# Patient Record
Sex: Female | Born: 1975 | Race: White | Hispanic: No | Marital: Married | Smoking: Never smoker
Health system: Southern US, Community
[De-identification: ages and names within clinical notes are randomized; demographics above are authoritative.]

## PROBLEM LIST (undated history)

## (undated) ENCOUNTER — Inpatient Hospital Stay (HOSPITAL_COMMUNITY): Payer: Self-pay

## (undated) DIAGNOSIS — D126 Benign neoplasm of colon, unspecified: Secondary | ICD-10-CM

## (undated) DIAGNOSIS — Z8619 Personal history of other infectious and parasitic diseases: Secondary | ICD-10-CM

## (undated) DIAGNOSIS — K649 Unspecified hemorrhoids: Secondary | ICD-10-CM

## (undated) DIAGNOSIS — D179 Benign lipomatous neoplasm, unspecified: Secondary | ICD-10-CM

## (undated) DIAGNOSIS — S62101A Fracture of unspecified carpal bone, right wrist, initial encounter for closed fracture: Secondary | ICD-10-CM

## (undated) DIAGNOSIS — F329 Major depressive disorder, single episode, unspecified: Secondary | ICD-10-CM

## (undated) DIAGNOSIS — F32A Depression, unspecified: Secondary | ICD-10-CM

## (undated) HISTORY — DX: Personal history of other infectious and parasitic diseases: Z86.19

## (undated) HISTORY — DX: Fracture of unspecified carpal bone, right wrist, initial encounter for closed fracture: S62.101A

## (undated) HISTORY — DX: Depression, unspecified: F32.A

## (undated) HISTORY — DX: Major depressive disorder, single episode, unspecified: F32.9

## (undated) HISTORY — DX: Unspecified hemorrhoids: K64.9

## (undated) HISTORY — PX: HERNIA REPAIR: SHX51

## (undated) HISTORY — PX: BREAST SURGERY: SHX581

## (undated) HISTORY — PX: HEMORRHOID BANDING: SHX5850

## (undated) HISTORY — DX: Benign neoplasm of colon, unspecified: D12.6

## (undated) HISTORY — PX: WRIST SURGERY: SHX841

---

## 1999-04-14 ENCOUNTER — Other Ambulatory Visit: Admission: RE | Admit: 1999-04-14 | Discharge: 1999-04-14 | Payer: Self-pay | Admitting: Obstetrics and Gynecology

## 1999-11-08 ENCOUNTER — Emergency Department (HOSPITAL_COMMUNITY): Admission: EM | Admit: 1999-11-08 | Discharge: 1999-11-08 | Payer: Self-pay

## 2003-06-11 ENCOUNTER — Other Ambulatory Visit: Admission: RE | Admit: 2003-06-11 | Discharge: 2003-06-11 | Payer: Self-pay | Admitting: *Deleted

## 2004-06-18 ENCOUNTER — Other Ambulatory Visit: Admission: RE | Admit: 2004-06-18 | Discharge: 2004-06-18 | Payer: Self-pay | Admitting: *Deleted

## 2005-10-23 ENCOUNTER — Other Ambulatory Visit: Admission: RE | Admit: 2005-10-23 | Discharge: 2005-10-23 | Payer: Self-pay | Admitting: *Deleted

## 2005-11-27 ENCOUNTER — Encounter: Admission: RE | Admit: 2005-11-27 | Discharge: 2006-01-20 | Payer: Self-pay | Admitting: Family Medicine

## 2006-12-01 ENCOUNTER — Other Ambulatory Visit: Admission: RE | Admit: 2006-12-01 | Discharge: 2006-12-01 | Payer: Self-pay | Admitting: Family Medicine

## 2007-06-05 ENCOUNTER — Emergency Department (HOSPITAL_COMMUNITY): Admission: EM | Admit: 2007-06-05 | Discharge: 2007-06-05 | Payer: Self-pay | Admitting: Emergency Medicine

## 2007-06-07 ENCOUNTER — Ambulatory Visit (HOSPITAL_BASED_OUTPATIENT_CLINIC_OR_DEPARTMENT_OTHER): Admission: RE | Admit: 2007-06-07 | Discharge: 2007-06-07 | Payer: Self-pay | Admitting: Orthopedic Surgery

## 2007-12-02 ENCOUNTER — Other Ambulatory Visit: Admission: RE | Admit: 2007-12-02 | Discharge: 2007-12-02 | Payer: Self-pay | Admitting: Family Medicine

## 2008-12-03 ENCOUNTER — Other Ambulatory Visit: Admission: RE | Admit: 2008-12-03 | Discharge: 2008-12-03 | Payer: Self-pay | Admitting: Family Medicine

## 2009-01-30 ENCOUNTER — Ambulatory Visit: Payer: Self-pay | Admitting: Women's Health

## 2009-02-21 ENCOUNTER — Ambulatory Visit: Payer: Self-pay | Admitting: Gynecology

## 2010-01-14 ENCOUNTER — Other Ambulatory Visit: Admission: RE | Admit: 2010-01-14 | Discharge: 2010-01-14 | Payer: Self-pay | Admitting: Family Medicine

## 2010-07-16 ENCOUNTER — Ambulatory Visit (INDEPENDENT_AMBULATORY_CARE_PROVIDER_SITE_OTHER): Payer: Managed Care, Other (non HMO) | Admitting: Women's Health

## 2010-07-16 DIAGNOSIS — B373 Candidiasis of vulva and vagina: Secondary | ICD-10-CM

## 2010-07-16 DIAGNOSIS — N898 Other specified noninflammatory disorders of vagina: Secondary | ICD-10-CM

## 2010-07-16 DIAGNOSIS — Z113 Encounter for screening for infections with a predominantly sexual mode of transmission: Secondary | ICD-10-CM

## 2010-07-22 NOTE — Op Note (Signed)
Erin Thompson, Erin Thompson               ACCOUNT NO.:  [id number]   MEDICAL RECORD NO.:  [id number]          PATIENT TYPE:  AMB   LOCATION:  DSC                          FACILITY:  [facility]   PHYSICIAN:  Cindee Salt, M.D.       DATE OF BIRTH:  Jul 25, 1975   DATE OF PROCEDURE:  06/07/2007  DATE OF DISCHARGE:                               OPERATIVE REPORT   PREOPERATIVE DIAGNOSIS:  Comminuted fracture of distal radius left wrist  with carpal tunnel syndrome   POSTOPERATIVE DIAGNOSIS:  Comminuted fracture of distal radius left  wrist with carpal tunnel syndrome   OPERATION:  Open reduction and internal fixation of distal radius  fracture with DVR plate and carpal tunnel release right hand.   SURGEON:  Cindee Salt, M.D.   ASSISTANTCarolyne Fiscal.   ANESTHESIA:  Axillary block.   INDICATIONS FOR OPERATION:  A comminuted fracture of her distal radius.  She had undergone reduction in the emergency room and was referred for  continued care.  She has numbness and tingling of all of her fingers.  She is desirous of release, open reduction internal fixation after  discussing options including cast fixation with potential for loss of  reduction.  She is aware of risks and complications including infection,  recurrence, injury to arteries, nerves, tendons, incomplete relief of  symptoms and dystrophy, the possibility of loss of fixation, nonunion.  In the preoperative area the patient is seen, questions encouraged and  answered.  The extremity marked by both the patient and surgeon.  A  supraclavicular block was given by Dr. Jacklynn Bue.   PROCEDURE:  The patient was brought to the operating room where she was  prepped and draped using DuraPrep, supine position, right arm free.  After a time-out, a volar radial incision was made over the flexor carpi  radialis tendon, taken to the radial aspect of the styloid, carried down  through subcutaneous tissue.  Bleeders were electrocauterized.  Dissection carried  between the flexor carpi radialis tendon and the  radial artery.  The pronator quadratus was found to be interspersed into  the fracture.  This was incised on its radial aspect and freed over the  volar aspect of the distal radius.  The fracture was manipulated,  reduced, found to be comminuted especially dorsally.  The  brachioradialis was released.  A standard DVR plate for the right side  was placed.  This was imaged under image intensification and found to be  slightly large.  A small plate was then selected.  This was affixed  proximally and in the sliding screw, drilled with a 2.5-mm drill bit,  measured at 10 mm.  A 10-mm screw was placed fixing the plate to the  radius after pinning it with two 0.045 K-wires.  Confirmation of the  position was made with the [facility]. This was slightly adjusted.  The distal  pegs were then placed.  These measured between 20 and 24 mm.  The radial  styloid was fixed with screws that were locking.  X-rays in AP lateral  direction oblique revealed that no pins penetrated through the  articular  surface into the joint, that the fracture was reduced in both AP and  lateral directions with reconstitution of tilt, slope and length.  The  remaining proximal screws were then placed with 10 and one 12-mm screw.  X-rays confirmed positioning.  The wound was irrigated.  The pronator  quadratus was repaired with figure-of-eight 4-0 Vicryl sutures covering  the entire plate.  The subcutaneous tissue was closed interrupted 4-0  Vicryl and the skin with interrupted 4-0 Vicryl Rapide sutures.   A separate incision was then made longitudinally in the palm, carried  down through subcutaneous tissue.  Bleeders again electrocauterized.  Dissection carried down to the palmar fascia.  This was split.  Superficial palmar arch was identified.  Flexor tendon to the ring and  little finger identified.  With sharp dissection the carpal retinaculum  was incised.  Significant blood  was present within the carpal canal.  This was evacuated.  The wound was irrigated.  A Sewall retractor and a  right-angle retractor were placed for release of the palmar fascia and  distal forearm fascia for approximately 1 cm proximal to the wrist  crease.  The wound was irrigated.  The skin was then closed interrupted  4-0 Vicryl Rapide sutures.  A sterile compressive dressing and splint  with the fingers free was applied.  The patient tolerated the procedure  well and was taken to the recovery room for observation in satisfactory  condition.  With deflation of the tourniquet, all fingers immediately  pinked.  She will be discharged home to return to the [facility]  [facility] in 1 week on Percocet.           ______________________________  Cindee Salt, M.D.     GK/MEDQ  D:  06/07/2007  T:  06/07/2007  Job:  [id number]   cc:   Chales Salmon. Abigail Miyamoto, M.D.

## 2010-08-11 ENCOUNTER — Ambulatory Visit: Payer: Managed Care, Other (non HMO) | Admitting: Women's Health

## 2010-08-18 ENCOUNTER — Ambulatory Visit (INDEPENDENT_AMBULATORY_CARE_PROVIDER_SITE_OTHER): Payer: Managed Care, Other (non HMO) | Admitting: Women's Health

## 2010-08-18 DIAGNOSIS — Z113 Encounter for screening for infections with a predominantly sexual mode of transmission: Secondary | ICD-10-CM

## 2010-08-18 DIAGNOSIS — Z708 Other sex counseling: Secondary | ICD-10-CM

## 2010-12-25 ENCOUNTER — Encounter: Payer: Self-pay | Admitting: Women's Health

## 2010-12-25 ENCOUNTER — Ambulatory Visit (INDEPENDENT_AMBULATORY_CARE_PROVIDER_SITE_OTHER): Payer: Managed Care, Other (non HMO) | Admitting: Women's Health

## 2010-12-25 ENCOUNTER — Other Ambulatory Visit (HOSPITAL_COMMUNITY)
Admission: RE | Admit: 2010-12-25 | Discharge: 2010-12-25 | Disposition: A | Discharge status: Home / Self Care | Payer: Managed Care, Other (non HMO) | Source: Ambulatory Visit | Attending: Women's Health | Admitting: Women's Health

## 2010-12-25 VITALS — BP 110/70 | Ht 69.0 in | Wt 142.0 lb

## 2010-12-25 DIAGNOSIS — Z01419 Encounter for gynecological examination (general) (routine) without abnormal findings: Secondary | ICD-10-CM | POA: Insufficient documentation

## 2010-12-25 DIAGNOSIS — Z309 Encounter for contraceptive management, unspecified: Secondary | ICD-10-CM

## 2010-12-25 DIAGNOSIS — IMO0001 Reserved for inherently not codable concepts without codable children: Secondary | ICD-10-CM

## 2010-12-25 DIAGNOSIS — Z113 Encounter for screening for infections with a predominantly sexual mode of transmission: Secondary | ICD-10-CM

## 2010-12-25 DIAGNOSIS — Z23 Encounter for immunization: Secondary | ICD-10-CM

## 2010-12-25 DIAGNOSIS — R82998 Other abnormal findings in urine: Secondary | ICD-10-CM

## 2010-12-25 LAB — HEPATITIS C ANTIBODY: HCV Ab: NEGATIVE

## 2010-12-25 MED ORDER — NORETHIN ACE-ETH ESTRAD-FE 1-20 MG-MCG PO TABS: 1.0000 | ORAL_TABLET | Freq: Every day | ORAL | Status: DC

## 2010-12-25 NOTE — Progress Notes (Signed)
Erin Thompson 17-Jan-1976 [id number]    History:    The patient presents for annual exam.  Teaches computer science.   Past medical history, past surgical history, family history and social history were all reviewed and documented in the EPIC chart.   ROS:  A  ROS was performed and pertinent positives and negatives are included in the history.  Exam:  Filed Vitals:   12/25/10 1146  BP: 110/70    General appearance:  Normal Head/Neck:  Normal, without cervical or supraclavicular adenopathy. Thyroid:  Symmetrical, normal in size, without palpable masses or nodularity. Respiratory  Effort:  Normal  Auscultation:  Clear without wheezing or rhonchi Cardiovascular  Auscultation:  Regular rate, without rubs, murmurs or gallops  Edema/varicosities:  Not grossly evident Abdominal  Soft,nontender, without masses, guarding or rebound.  Liver/spleen:  No organomegaly noted  Hernia:  None appreciated  Skin  Inspection:  Grossly normal  Palpation:  Grossly normal Neurologic/psychiatric  Orientation:  Normal with appropriate conversation.  Mood/affect:  Normal  Genitourinary    Breasts: Examined lying and sitting.     Right: Without masses, retractions, discharge or axillary adenopathy.     Left: Without masses, retractions, discharge or axillary adenopathy.   Inguinal/mons:  Normal without inguinal adenopathy  External genitalia:  Normal  BUS/Urethra/Skene's glands:  Normal  Bladder:  Normal  Vagina:  Normal  Cervix:  Normal  Uterus:   normal in size, shape and contour.  Midline and mobile  Adnexa/parametria:     Rt: Without masses or tenderness.   Lt: Without masses or tenderness.  Anus and perineum: Normal  Digital rectal exam: Normal sphincter tone without palpated masses or tenderness  Assessment/Plan:  35 y.o.SWF G0 for annual exam. Monthly 4 day cycle on Loestrin 1/20 with no complaints. Requests an STD screen. Anxiety and depression, sees Dr. Evelene Croon, prescribes Pristiq  50 daily, states her symptoms are stable.  Normal GYN exam  Plan: Loestrin 1/20, prescription, proper use, review slight risk for blood clots and strokes. Condoms encouraged if sexually active. SBEs, exercise, MVI daily encouraged, has a  healthy lifestyle and will continue. CBC, UA, Pap, GC/Chlamydia, HIV, RPR, hepatitis B and C.   Harrington Challenger WHNP, 12:12 PM 12/25/2010

## 2011-08-28 ENCOUNTER — Emergency Department (HOSPITAL_COMMUNITY)
Admission: EM | Admit: 2011-08-28 | Discharge: 2011-08-28 | Disposition: A | Discharge status: Home / Self Care | Payer: Managed Care, Other (non HMO) | Attending: Emergency Medicine | Admitting: Emergency Medicine

## 2011-08-28 ENCOUNTER — Other Ambulatory Visit: Payer: Self-pay | Admitting: Family Medicine

## 2011-08-28 ENCOUNTER — Ambulatory Visit
Admission: RE | Admit: 2011-08-28 | Discharge: 2011-08-28 | Disposition: A | Discharge status: Home / Self Care | Payer: Managed Care, Other (non HMO) | Source: Ambulatory Visit | Attending: Family Medicine | Admitting: Family Medicine

## 2011-08-28 ENCOUNTER — Encounter (HOSPITAL_COMMUNITY): Payer: Self-pay | Admitting: *Deleted

## 2011-08-28 DIAGNOSIS — N151 Renal and perinephric abscess: Secondary | ICD-10-CM

## 2011-08-28 DIAGNOSIS — R109 Unspecified abdominal pain: Secondary | ICD-10-CM

## 2011-08-28 DIAGNOSIS — R509 Fever, unspecified: Secondary | ICD-10-CM | POA: Insufficient documentation

## 2011-08-28 DIAGNOSIS — R5381 Other malaise: Secondary | ICD-10-CM | POA: Insufficient documentation

## 2011-08-28 LAB — URINALYSIS, MICROSCOPIC ONLY
Bilirubin Urine: NEGATIVE
Glucose, UA: NEGATIVE mg/dL
Ketones, ur: 15 mg/dL — AB
Leukocytes, UA: NEGATIVE
Nitrite: NEGATIVE
Protein, ur: NEGATIVE mg/dL
Specific Gravity, Urine: >1.046 — ABNORMAL HIGH (ref 1.005–1.030)
Urobilinogen, UA: 0.2 mg/dL (ref 0.0–1.0)
pH: 6.5 (ref 5.0–8.0)

## 2011-08-28 LAB — DIFFERENTIAL
Basophils Absolute: 0 10*3/uL (ref 0.0–0.1)
Basophils Relative: 0 % (ref 0–1)
Eosinophils Absolute: 0 10*3/uL (ref 0.0–0.7)
Eosinophils Relative: 1 % (ref 0–5)
Lymphocytes Relative: 24 % (ref 12–46)
Lymphs Abs: 1.6 10*3/uL (ref 0.7–4.0)
Monocytes Absolute: 0.6 10*3/uL (ref 0.1–1.0)
Monocytes Relative: 9 % (ref 3–12)
Neutro Abs: 4.5 10*3/uL (ref 1.7–7.7)
Neutrophils Relative %: 66 % (ref 43–77)

## 2011-08-28 LAB — COMPREHENSIVE METABOLIC PANEL
ALT: 12 U/L (ref 0–35)
Albumin: 3.4 g/dL — ABNORMAL LOW (ref 3.5–5.2)
Alkaline Phosphatase: 72 U/L (ref 39–117)
Potassium: 3.9 mEq/L (ref 3.5–5.1)
Sodium: 136 mEq/L (ref 135–145)
Total Protein: 7.5 g/dL (ref 6.0–8.3)

## 2011-08-28 LAB — CBC
HCT: 38.9 % (ref 36.0–46.0)
Hemoglobin: 13.2 g/dL (ref 12.0–15.0)
MCH: 30.9 pg (ref 26.0–34.0)
MCHC: 33.9 g/dL (ref 30.0–36.0)
MCV: 91.1 fL (ref 78.0–100.0)
Platelets: 389 10*3/uL (ref 150–400)
RBC: 4.27 MIL/uL (ref 3.87–5.11)
RDW: 12.2 % (ref 11.5–15.5)
WBC: 6.8 10*3/uL (ref 4.0–10.5)

## 2011-08-28 LAB — PREGNANCY, URINE: Preg Test, Ur: NEGATIVE

## 2011-08-28 MED ORDER — IOHEXOL 300 MG/ML  SOLN
100.0000 mL | Freq: Once | INTRAMUSCULAR | Status: AC | PRN
  Administered 2011-08-28: 100 mL via INTRAVENOUS

## 2011-08-28 MED ORDER — CIPROFLOXACIN HCL 500 MG PO TABS: 500.0000 mg | ORAL_TABLET | Freq: Two times a day (BID) | ORAL | Status: AC

## 2011-08-28 MED ORDER — IBUPROFEN 800 MG PO TABS: 800.0000 mg | ORAL_TABLET | Freq: Three times a day (TID) | ORAL | Status: AC

## 2011-08-28 MED ORDER — OXYCODONE-ACETAMINOPHEN 5-325 MG PO TABS: 2.0000 | ORAL_TABLET | ORAL | Status: AC | PRN

## 2011-08-28 MED ORDER — IOHEXOL 300 MG/ML  SOLN
40.0000 mL | Freq: Once | INTRAMUSCULAR | Status: AC | PRN
  Administered 2011-08-28: 40 mL via ORAL

## 2011-08-28 NOTE — ED Notes (Signed)
Pt treated for UTI 2 weeks ago, given Cipro and macromid. Had CT scan today at [facility], diagnosed with kidney infection. Pt's pcp sent to ED. Pt reports fever of 104.2 yesterday, denies N/V, denies urinary symptoms. pcp told pt blood was present in urine.

## 2011-08-28 NOTE — ED Notes (Signed)
Pt states she was recently diagnosed with UTI, treated with antibiotics, states continued with fever and pain to right side, had CT by PCP and told now has "pus all around my kidney."

## 2011-08-28 NOTE — ED Provider Notes (Addendum)
History     CSN: [id number]  Arrival date & time 08/28/11  1552   First MD Initiated Contact with Patient 08/28/11 1639      Chief Complaint  Patient presents with  . Flank Pain    (Consider location/radiation/quality/duration/timing/severity/associated sxs/prior treatment) HPI Comments: Pt with 2 weeks hx of right flank pain.  Was seen in [place] in ED where she was traveling last week and started on macrobid for UTI/pyelo, was switched to cipro when she got back home and saw her PMD at [facility] physicians last Wednesday.  Took 7 days of cipro.  Has pain to right mid back, is now coming around to front right mid abdomen.  Has fevers intially up to 104, stopped having fevers 5 days ago, started back yesterday with temp to 99.  No n/v, but has had decreased appetitie.  No urinary symptoms.  Describes pain as dull and achy.  Patient is a 36 y.o. female presenting with flank pain. The history is provided by the patient.  Flank Pain This is a new problem. Episode onset: 2 weeks. The problem occurs constantly. The problem has been gradually worsening. Associated symptoms include abdominal pain. Pertinent negatives include no chest pain, no headaches and no shortness of breath.    Past Medical History  Diagnosis Date  . STD (sexually transmitted disease)     chlamydia 2012    Past Surgical History  Procedure Date  . Hernia repair     AT BIRTH  . Breast surgery     Bi-lat Implants    Family History  Problem Relation Age of Onset  . Hypertension Mother   . Diabetes Mother   . Diabetes Maternal Grandmother   . Diabetes Father   . Hypertension Father     History  Substance Use Topics  . Smoking status: Never Smoker   . Smokeless tobacco: Never Used  . Alcohol Use: No    OB History    Grav Para Term Preterm Abortions TAB SAB Ect Mult Living   0 0              Review of Systems  Constitutional: Positive for fever and fatigue. Negative for chills and diaphoresis.  HENT:  Negative for congestion, rhinorrhea and sneezing.   Eyes: Negative.   Respiratory: Negative for cough, chest tightness and shortness of breath.   Cardiovascular: Negative for chest pain and leg swelling.  Gastrointestinal: Positive for abdominal pain. Negative for nausea, vomiting, diarrhea and blood in stool.  Genitourinary: Positive for flank pain. Negative for urgency, frequency, hematuria, decreased urine volume, difficulty urinating and pelvic pain.  Musculoskeletal: Negative for back pain and arthralgias.  Skin: Negative for rash.  Neurological: Negative for dizziness, speech difficulty, weakness, numbness and headaches.    Allergies  Review of patient's allergies indicates no known allergies.  Home Medications   Current Outpatient Rx  Name Route Sig Dispense Refill  . BUPROPION HCL ER (XL) 150 MG PO TB24 Oral Take 150 mg by mouth daily.    . DESVENLAFAXINE SUCCINATE ER 50 MG PO TB24 Oral Take 50 mg by mouth daily.      Azzie Roup ACE-ETH ESTRAD-FE 1-20 MG-MCG PO TABS Oral Take 1 tablet by mouth daily. 1 Package 12  . CIPROFLOXACIN HCL 250 MG PO TABS Oral Take 250 mg by mouth 2 (two) times daily. For 7 days.    Marland Kitchen CIPROFLOXACIN HCL 500 MG PO TABS Oral Take 1 tablet (500 mg total) by mouth every 12 (twelve) hours. 20  tablet 0  . IBUPROFEN 800 MG PO TABS Oral Take 1 tablet (800 mg total) by mouth 3 (three) times daily. 21 tablet 0  . OXYCODONE-ACETAMINOPHEN 5-325 MG PO TABS Oral Take 2 tablets by mouth every 4 (four) hours as needed for pain. 15 tablet 0    BP 125/72  Pulse 76  Temp 98.5 F (36.9 C) (Oral)  Resp 20  SpO2 100%  LMP 07/28/2011  Physical Exam  Constitutional: She is oriented to person, place, and time. She appears well-developed and well-nourished.  HENT:  Head: Normocephalic and atraumatic.  Eyes: Pupils are equal, round, and reactive to light.  Neck: Normal range of motion. Neck supple.  Cardiovascular: Normal rate, regular rhythm and normal heart sounds.     Pulmonary/Chest: Effort normal and breath sounds normal. No respiratory distress. She has no wheezes. She has no rales. She exhibits no tenderness.  Abdominal: Soft. Bowel sounds are normal. There is no tenderness (moderate right flank tenderness). There is no rebound and no guarding.  Musculoskeletal: Normal range of motion. She exhibits no edema.  Lymphadenopathy:    She has no cervical adenopathy.  Neurological: She is alert and oriented to person, place, and time.  Skin: Skin is warm and dry. No rash noted.  Psychiatric: She has a normal mood and affect.    ED Course  Procedures (including critical care time)  Results for orders placed during the hospital encounter of 08/28/11  URINALYSIS, WITH MICROSCOPIC      Component Value Range   Color, Urine YELLOW  YELLOW   APPearance CLEAR  CLEAR   Specific Gravity, Urine >1.046 (*) 1.005 - 1.030   pH 6.5  5.0 - 8.0   Glucose, UA NEGATIVE  NEGATIVE mg/dL   Hgb urine dipstick TRACE (*) NEGATIVE   Bilirubin Urine NEGATIVE  NEGATIVE   Ketones, ur 15 (*) NEGATIVE mg/dL   Protein, ur NEGATIVE  NEGATIVE mg/dL   Urobilinogen, UA 0.2  0.0 - 1.0 mg/dL   Nitrite NEGATIVE  NEGATIVE   Leukocytes, UA NEGATIVE  NEGATIVE   RBC / HPF 0-2  <3 RBC/hpf   Squamous Epithelial / LPF FEW (*) RARE  PREGNANCY, URINE      Component Value Range   Preg Test, Ur NEGATIVE  NEGATIVE  CBC      Component Value Range   WBC 6.8  4.0 - 10.5 K/uL   RBC 4.27  3.87 - 5.11 MIL/uL   Hemoglobin 13.2  12.0 - 15.0 g/dL   HCT 47.8  29.5 - 62.1 %   MCV 91.1  78.0 - 100.0 fL   MCH 30.9  26.0 - 34.0 pg   MCHC 33.9  30.0 - 36.0 g/dL   RDW 30.8  65.7 - 84.6 %   Platelets 389  150 - 400 K/uL  DIFFERENTIAL      Component Value Range   Neutrophils Relative 66  43 - 77 %   Neutro Abs 4.5  1.7 - 7.7 K/uL   Lymphocytes Relative 24  12 - 46 %   Lymphs Abs 1.6  0.7 - 4.0 K/uL   Monocytes Relative 9  3 - 12 %   Monocytes Absolute 0.6  0.1 - 1.0 K/uL   Eosinophils Relative 1   0 - 5 %   Eosinophils Absolute 0.0  0.0 - 0.7 K/uL   Basophils Relative 0  0 - 1 %   Basophils Absolute 0.0  0.0 - 0.1 K/uL  COMPREHENSIVE METABOLIC PANEL  Component Value Range   Sodium 136  135 - 145 mEq/L   Potassium 3.9  3.5 - 5.1 mEq/L   Chloride 99  96 - 112 mEq/L   CO2 30  19 - 32 mEq/L   Glucose, Bld 147 (*) 70 - 99 mg/dL   BUN 10  6 - 23 mg/dL   Creatinine, Ser 2.95  0.50 - 1.10 mg/dL   Calcium 9.6  8.4 - 62.1 mg/dL   Total Protein 7.5  6.0 - 8.3 g/dL   Albumin 3.4 (*) 3.5 - 5.2 g/dL   AST 12  0 - 37 U/L   ALT 12  0 - 35 U/L   Alkaline Phosphatase 72  39 - 117 U/L   Total Bilirubin 0.3  0.3 - 1.2 mg/dL   GFR calc non Af Amer 80 (*) >90 mL/min   GFR calc Af Amer >90  >90 mL/min   Ct Abdomen Pelvis W Contrast  08/28/2011  *RADIOLOGY REPORT*  Clinical Data: Abdominal pain.  CT ABDOMEN AND PELVIS WITH CONTRAST  Technique:  Multidetector CT imaging of the abdomen and pelvis was performed following the standard protocol during bolus administration of intravenous contrast.  Contrast: 40mL OMNIPAQUE IOHEXOL 300 MG/ML  SOLN, OMNIPAQUE IOHEXOL 300 MG/ML  SOLN  Comparison: None.  Findings: Limited images through the lung bases demonstrate no significant appreciable abnormality. The heart size is within normal limits. No pleural or pericardial effusion.  Unremarkable liver, spleen, pancreas, adrenal glands.  The left kidney has a lobular contour with areas of scarring involving the upper pole and lower pole.  This likely reflects sequelae of prior infection.  On the right, there is striated enhancement of the lower pole. There is a peripherally enhancing/septated mass arising from the interpolar/lower pole right kidney anteriorly.  There is right perinephric and anterior pararenal space fat stranding.  Small amount of fluid collects within the right paracolic gutter.  There is urothelial hyperenhancement of the right renal pelvis and ureter. No hydronephrosis or hydroureter.  No bowel  obstruction.  No CT evidence for colitis.  Normal appendix.  No free intraperitoneal air.  Normal caliber vasculature.  No lymphadenopathy.  Reactive sized retroperitoneal lymph nodes.  Small amount of free fluid within the pelvis.  Thin-walled bladder. Unremarkable CT appearance to the uterus and adnexa.  No acute osseous finding.  IMPRESSION: Findings are most in keeping with acute right pyelonephritis. There is a 2.6 cm peripherally enhancing mass arising from the interpolar/lower pole anteriorly, favored to reflect a renal abscess in this setting. Ultrasound follow-up after therapy recommended to document resolution.  There is right perinephric and anterior pararenal space stranding/fluid however no extrarenal abscess at this time.  Lobular left renal contour suggests areas of scarring from prior infection.  Discussed via telephone with Dr. Clarene Duke (covering for Dr. Ihor Dow) at 02:20 p.m. on 08/28/2011.  Original Report Authenticated By: Waneta Martins, M.D.     Ct Abdomen Pelvis W Contrast  08/28/2011  *RADIOLOGY REPORT*  Clinical Data: Abdominal pain.  CT ABDOMEN AND PELVIS WITH CONTRAST  Technique:  Multidetector CT imaging of the abdomen and pelvis was performed following the standard protocol during bolus administration of intravenous contrast.  Contrast: 40mL OMNIPAQUE IOHEXOL 300 MG/ML  SOLN, OMNIPAQUE IOHEXOL 300 MG/ML  SOLN  Comparison: None.  Findings: Limited images through the lung bases demonstrate no significant appreciable abnormality. The heart size is within normal limits. No pleural or pericardial effusion.  Unremarkable liver, spleen, pancreas, adrenal glands.  The left  kidney has a lobular contour with areas of scarring involving the upper pole and lower pole.  This likely reflects sequelae of prior infection.  On the right, there is striated enhancement of the lower pole. There is a peripherally enhancing/septated mass arising from the interpolar/lower pole right kidney  anteriorly.  There is right perinephric and anterior pararenal space fat stranding.  Small amount of fluid collects within the right paracolic gutter.  There is urothelial hyperenhancement of the right renal pelvis and ureter. No hydronephrosis or hydroureter.  No bowel obstruction.  No CT evidence for colitis.  Normal appendix.  No free intraperitoneal air.  Normal caliber vasculature.  No lymphadenopathy.  Reactive sized retroperitoneal lymph nodes.  Small amount of free fluid within the pelvis.  Thin-walled bladder. Unremarkable CT appearance to the uterus and adnexa.  No acute osseous finding.  IMPRESSION: Findings are most in keeping with acute right pyelonephritis. There is a 2.6 cm peripherally enhancing mass arising from the interpolar/lower pole anteriorly, favored to reflect a renal abscess in this setting. Ultrasound follow-up after therapy recommended to document resolution.  There is right perinephric and anterior pararenal space stranding/fluid however no extrarenal abscess at this time.  Lobular left renal contour suggests areas of scarring from prior infection.  Discussed via telephone with Dr. Clarene Duke (covering for Dr. Ihor Dow) at 02:20 p.m. on 08/28/2011.  Original Report Authenticated By: Waneta Martins, M.D.     1. Renal abscess       MDM  Discussed pt with Dr. Hillis Range.  He feels that pt can be safely treated at home with 10 day course of cipro.  Will also give rx for pain meds.  Pt to have close f/u with [facility].  Pt looks well, no vomiting, no fevers, laying comfortably in bed        Rolan Bucco, MD 08/28/11 1835  Rolan Bucco, MD 09/26/11 [phone]

## 2011-08-30 LAB — URINE CULTURE

## 2011-12-28 ENCOUNTER — Ambulatory Visit (INDEPENDENT_AMBULATORY_CARE_PROVIDER_SITE_OTHER): Payer: Managed Care, Other (non HMO) | Admitting: Women's Health

## 2011-12-28 ENCOUNTER — Encounter: Payer: Self-pay | Admitting: Women's Health

## 2011-12-28 VITALS — BP 134/76 | Ht 68.0 in | Wt 145.0 lb

## 2011-12-28 DIAGNOSIS — N390 Urinary tract infection, site not specified: Secondary | ICD-10-CM

## 2011-12-28 DIAGNOSIS — F419 Anxiety disorder, unspecified: Secondary | ICD-10-CM

## 2011-12-28 DIAGNOSIS — N898 Other specified noninflammatory disorders of vagina: Secondary | ICD-10-CM

## 2011-12-28 DIAGNOSIS — N920 Excessive and frequent menstruation with regular cycle: Secondary | ICD-10-CM

## 2011-12-28 DIAGNOSIS — N949 Unspecified condition associated with female genital organs and menstrual cycle: Secondary | ICD-10-CM

## 2011-12-28 DIAGNOSIS — F32A Depression, unspecified: Secondary | ICD-10-CM

## 2011-12-28 DIAGNOSIS — Z833 Family history of diabetes mellitus: Secondary | ICD-10-CM

## 2011-12-28 DIAGNOSIS — Z01419 Encounter for gynecological examination (general) (routine) without abnormal findings: Secondary | ICD-10-CM

## 2011-12-28 DIAGNOSIS — N151 Renal and perinephric abscess: Secondary | ICD-10-CM

## 2011-12-28 DIAGNOSIS — Z113 Encounter for screening for infections with a predominantly sexual mode of transmission: Secondary | ICD-10-CM

## 2011-12-28 DIAGNOSIS — F329 Major depressive disorder, single episode, unspecified: Secondary | ICD-10-CM | POA: Insufficient documentation

## 2011-12-28 DIAGNOSIS — N938 Other specified abnormal uterine and vaginal bleeding: Secondary | ICD-10-CM | POA: Insufficient documentation

## 2011-12-28 DIAGNOSIS — Z309 Encounter for contraceptive management, unspecified: Secondary | ICD-10-CM

## 2011-12-28 DIAGNOSIS — F341 Dysthymic disorder: Secondary | ICD-10-CM

## 2011-12-28 DIAGNOSIS — IMO0001 Reserved for inherently not codable concepts without codable children: Secondary | ICD-10-CM

## 2011-12-28 LAB — URINALYSIS W MICROSCOPIC + REFLEX CULTURE
Casts: NONE SEEN
Ketones, ur: NEGATIVE mg/dL
Nitrite: NEGATIVE
Protein, ur: >300 mg/dL — AB
pH: 7 (ref 5.0–8.0)

## 2011-12-28 LAB — WET PREP FOR TRICH, YEAST, CLUE

## 2011-12-28 LAB — HEPATITIS C ANTIBODY: HCV Ab: NEGATIVE

## 2011-12-28 LAB — CBC WITH DIFFERENTIAL/PLATELET
Basophils Absolute: 0 10*3/uL (ref 0.0–0.1)
HCT: 42.1 % (ref 36.0–46.0)
Lymphocytes Relative: 28 % (ref 12–46)
Lymphs Abs: 3.4 10*3/uL (ref 0.7–4.0)
Neutro Abs: 7.7 10*3/uL (ref 1.7–7.7)
Platelets: 231 10*3/uL (ref 150–400)
RBC: 4.63 MIL/uL (ref 3.87–5.11)
RDW: 12.9 % (ref 11.5–15.5)
WBC: 12.4 10*3/uL — ABNORMAL HIGH (ref 4.0–10.5)

## 2011-12-28 LAB — HEPATITIS B SURFACE ANTIGEN: Hepatitis B Surface Ag: NEGATIVE

## 2011-12-28 MED ORDER — CIPROFLOXACIN HCL 500 MG PO TABS: 500.0000 mg | ORAL_TABLET | Freq: Two times a day (BID) | ORAL | Status: DC

## 2011-12-28 MED ORDER — NORETHIN ACE-ETH ESTRAD-FE 1-20 MG-MCG PO TABS: 1.0000 | ORAL_TABLET | Freq: Every day | ORAL | Status: DC

## 2011-12-28 MED ORDER — METRONIDAZOLE 0.75 % VA GEL: VAGINAL | Status: DC

## 2011-12-28 MED ORDER — NORGESTREL-ETHINYL ESTRADIOL 0.3-30 MG-MCG PO TABS: 1.0000 | ORAL_TABLET | Freq: Every day | ORAL | Status: DC

## 2011-12-28 NOTE — Progress Notes (Signed)
Erin Thompson October 19, 1975 [id number]    History:    The patient presents for annual exam.  Complained of intermittent menstrual bleeding/spotting throughout cycle and after intercourse. Currently on Loestrin 1/20 with no missed pills. Treated for a UTI last week with amoxicillin with no relief of symptoms at primary care. New partner in July. History right renal abscess June 2013, MRI/kidney in July was normal at urology office. History of spotting in the past had a normal sonohysterogram 2010. History of anxiety/depression on pristique prescribed by Dr. Evelene Croon. History of positive Chlamydia 2012. One abnormal Pap many years ago, normal 2011 and 2012.  Past medical history, past surgical history, family history and social history were all reviewed and documented in the EPIC chart. Designer, multimedia. Both parents diabetics and hypertensive.   ROS:  A  ROS was performed and pertinent positives and negatives are included in the history.  Exam:  Filed Vitals:   12/28/11 0918  BP: 134/76    General appearance:  Normal Head/Neck:  Normal, without cervical or supraclavicular adenopathy. Thyroid:  Symmetrical, normal in size, without palpable masses or nodularity. Respiratory  Effort:  Normal  Auscultation:  Clear without wheezing or rhonchi Cardiovascular  Auscultation:  Regular rate, without rubs, murmurs or gallops  Edema/varicosities:  Not grossly evident Abdominal  Soft,nontender, without masses, guarding or rebound.  Liver/spleen:  No organomegaly noted  Hernia:  None appreciated  Skin  Inspection:  Grossly normal  Palpation:  Grossly normal Neurologic/psychiatric  Orientation:  Normal with appropriate conversation.  Mood/affect:  Normal  Genitourinary    Breasts: Examined lying and sitting.     Right: Without masses, retractions, discharge or axillary adenopathy.     Left: Without masses, retractions, discharge or axillary adenopathy.   Inguinal/mons:  Normal without inguinal  adenopathy  External genitalia:  Normal  BUS/Urethra/Skene's glands:  Normal  Bladder:  Normal  Vagina:  Normal/few clue cells  Cervix:  Normal no visible bleeding  Uterus:   normal in size, shape and contour.  Midline and mobile  Adnexa/parametria:     Rt: Without masses or tenderness.   Lt: Without masses or tenderness.  Anus and perineum: Normal  Digital rectal exam: Normal sphincter tone without palpated masses or tenderness  Assessment/Plan:  36 y.o. S. WF G0 for annual exam complaint of spotting/bleeding throughout cycle and post coitus.  Also complained of left flank pain, urinary frequency and pain.  DUB Mild BV STD screen UTI with history of renal abscess June 2013. Anxiety/depression stable on pristique-Dr. Evelene Croon  Plan: Cipro 500 twice a day 5 days. Return to office in 2 weeks for test of cure UA. Low ovral prescription, proper use given and reviewed slight risk for blood clots and strokes, finish out current pack of Loestrin 1/20. MetroGel vaginal cream 1 applicator at bedtime x5, alcohol precautions reviewed. Instructed to call if spotting persists. Has had some left lower quadrant discomfort, if persists will check an ultrasound after UTI treated. SBE's, exercise, calcium rich diet, MVI daily encouraged. CBC, glucose, TSH, UA, U PT (neg), GC/Chlamydia, HIV, hep B, C., RPR. No Pap normal Pap 2012 new screening guidelines reviewed.    Harrington Challenger WHNP, 10:04 AM 12/28/2011

## 2011-12-28 NOTE — Addendum Note (Signed)
Addended by: Harrington Challenger on: 12/28/2011 12:44 PM   Modules accepted: Level of Service

## 2011-12-28 NOTE — Patient Instructions (Signed)
BRCA testing Health Maintenance, Females A healthy lifestyle and preventative care can promote health and wellness.  Maintain regular health, dental, and eye exams.  Eat a healthy diet. Foods like vegetables, fruits, whole grains, low-fat dairy products, and lean protein foods contain the nutrients you need without too many calories. Decrease your intake of foods high in solid fats, added sugars, and salt. Get information about a proper diet from your caregiver, if necessary.  Regular physical exercise is one of the most important things you can do for your health. Most adults should get at least 150 minutes of moderate-intensity exercise (any activity that increases your heart rate and causes you to sweat) each week. In addition, most adults need muscle-strengthening exercises on 2 or more days a week.   Maintain a healthy weight. The body mass index (BMI) is a screening tool to identify possible weight problems. It provides an estimate of body fat based on height and weight. Your caregiver can help determine your BMI, and can help you achieve or maintain a healthy weight. For adults 20 years and older:  A BMI below 18.5 is considered underweight.  A BMI of 18.5 to 24.9 is normal.  A BMI of 25 to 29.9 is considered overweight.  A BMI of 30 and above is considered obese.  Maintain normal blood lipids and cholesterol by exercising and minimizing your intake of saturated fat. Eat a balanced diet with plenty of fruits and vegetables. Blood tests for lipids and cholesterol should begin at age 20 and be repeated every 5 years. If your lipid or cholesterol levels are high, you are over 50, or you are a high risk for heart disease, you may need your cholesterol levels checked more frequently.Ongoing high lipid and cholesterol levels should be treated with medicines if diet and exercise are not effective.  If you smoke, find out from your caregiver how to quit. If you do not use tobacco, do not  start.  If you are pregnant, do not drink alcohol. If you are breastfeeding, be very cautious about drinking alcohol. If you are not pregnant and choose to drink alcohol, do not exceed 1 drink per day. One drink is considered to be 12 ounces (355 mL) of beer, 5 ounces (148 mL) of wine, or 1.5 ounces (44 mL) of liquor.  Avoid use of street drugs. Do not share needles with anyone. Ask for help if you need support or instructions about stopping the use of drugs.  High blood pressure causes heart disease and increases the risk of stroke. Blood pressure should be checked at least every 1 to 2 years. Ongoing high blood pressure should be treated with medicines, if weight loss and exercise are not effective.  If you are 55 to 34 years old, ask your caregiver if you should take aspirin to prevent strokes.  Diabetes screening involves taking a blood sample to check your fasting blood sugar level. This should be done once every 3 years, after age 45, if you are within normal weight and without risk factors for diabetes. Testing should be considered at a younger age or be carried out more frequently if you are overweight and have at least 1 risk factor for diabetes.  Breast cancer screening is essential preventative care for women. You should practice "breast self-awareness." This means understanding the normal appearance and feel of your breasts and may include breast self-examination. Any changes detected, no matter how small, should be reported to a caregiver. Women in their 20s and 30s   should have a clinical breast exam (CBE) by a caregiver as part of a regular health exam every 1 to 3 years. After age 40, women should have a CBE every year. Starting at age 40, women should consider having a mammogram (breast X-ray) every year. Women who have a family history of breast cancer should talk to their caregiver about genetic screening. Women at a high risk of breast cancer should talk to their caregiver about having  an MRI and a mammogram every year.  The Pap test is a screening test for cervical cancer. Women should have a Pap test starting at age 21. Between ages 21 and 29, Pap tests should be repeated every 2 years. Beginning at age 30, you should have a Pap test every 3 years as long as the past 3 Pap tests have been normal. If you had a hysterectomy for a problem that was not cancer or a condition that could lead to cancer, then you no longer need Pap tests. If you are between ages 65 and 70, and you have had normal Pap tests going back 10 years, you no longer need Pap tests. If you have had past treatment for cervical cancer or a condition that could lead to cancer, you need Pap tests and screening for cancer for at least 20 years after your treatment. If Pap tests have been discontinued, risk factors (such as a new sexual partner) need to be reassessed to determine if screening should be resumed. Some women have medical problems that increase the chance of getting cervical cancer. In these cases, your caregiver may recommend more frequent screening and Pap tests.  The human papillomavirus (HPV) test is an additional test that may be used for cervical cancer screening. The HPV test looks for the virus that can cause the cell changes on the cervix. The cells collected during the Pap test can be tested for HPV. The HPV test could be used to screen women aged 30 years and older, and should be used in women of any age who have unclear Pap test results. After the age of 30, women should have HPV testing at the same frequency as a Pap test.  Colorectal cancer can be detected and often prevented. Most routine colorectal cancer screening begins at the age of 50 and continues through age 75. However, your caregiver may recommend screening at an earlier age if you have risk factors for colon cancer. On a yearly basis, your caregiver may provide home test kits to check for hidden blood in the stool. Use of a small camera at  the end of a tube, to directly examine the colon (sigmoidoscopy or colonoscopy), can detect the earliest forms of colorectal cancer. Talk to your caregiver about this at age 50, when routine screening begins. Direct examination of the colon should be repeated every 5 to 10 years through age 75, unless early forms of pre-cancerous polyps or small growths are found.  Hepatitis C blood testing is recommended for all people born from 1945 through 1965 and any individual with known risks for hepatitis C.  Practice safe sex. Use condoms and avoid high-risk sexual practices to reduce the spread of sexually transmitted infections (STIs). Sexually active women aged 25 and younger should be checked for Chlamydia, which is a common sexually transmitted infection. Older women with new or multiple partners should also be tested for Chlamydia. Testing for other STIs is recommended if you are sexually active and at increased risk.  Osteoporosis is a disease in   which the bones lose minerals and strength with aging. This can result in serious bone fractures. The risk of osteoporosis can be identified using a bone density scan. Women ages 65 and over and women at risk for fractures or osteoporosis should discuss screening with their caregivers. Ask your caregiver whether you should be taking a calcium supplement or vitamin D to reduce the rate of osteoporosis.  Menopause can be associated with physical symptoms and risks. Hormone replacement therapy is available to decrease symptoms and risks. You should talk to your caregiver about whether hormone replacement therapy is right for you.  Use sunscreen with a sun protection factor (SPF) of 30 or greater. Apply sunscreen liberally and repeatedly throughout the day. You should seek shade when your shadow is shorter than you. Protect yourself by wearing long sleeves, pants, a wide-brimmed hat, and sunglasses year round, whenever you are outdoors.  Notify your caregiver of new  moles or changes in moles, especially if there is a change in shape or color. Also notify your caregiver if a mole is larger than the size of a pencil eraser.  Stay current with your immunizations. Document Released: 09/08/2010 Document Revised: 05/18/2011 Document Reviewed: 09/08/2010 [employer] Patient Information 2013 [employer].  

## 2011-12-29 ENCOUNTER — Encounter: Payer: Self-pay | Admitting: Gynecology

## 2011-12-29 LAB — GC/CHLAMYDIA PROBE AMP, GENITAL: GC Probe Amp, Genital: NEGATIVE

## 2011-12-29 LAB — GLUCOSE, RANDOM: Glucose, Bld: 81 mg/dL (ref 70–99)

## 2011-12-30 ENCOUNTER — Other Ambulatory Visit: Payer: Self-pay | Admitting: Women's Health

## 2011-12-30 DIAGNOSIS — N390 Urinary tract infection, site not specified: Secondary | ICD-10-CM

## 2012-01-11 ENCOUNTER — Other Ambulatory Visit: Payer: Managed Care, Other (non HMO)

## 2012-01-11 DIAGNOSIS — N390 Urinary tract infection, site not specified: Secondary | ICD-10-CM

## 2012-01-12 LAB — URINALYSIS W MICROSCOPIC + REFLEX CULTURE
Leukocytes, UA: NEGATIVE
Protein, ur: NEGATIVE mg/dL
Urobilinogen, UA: 0.2 mg/dL (ref 0.0–1.0)

## 2012-01-15 ENCOUNTER — Other Ambulatory Visit: Payer: Self-pay | Admitting: Women's Health

## 2013-01-30 ENCOUNTER — Other Ambulatory Visit: Payer: Self-pay | Admitting: Women's Health

## 2013-01-30 NOTE — Telephone Encounter (Signed)
Pt overdue for her annual exam. Pt will be called to schedule . KW

## 2013-02-09 ENCOUNTER — Encounter: Payer: Self-pay | Admitting: Women's Health

## 2013-02-09 ENCOUNTER — Other Ambulatory Visit (HOSPITAL_COMMUNITY)
Admission: RE | Admit: 2013-02-09 | Discharge: 2013-02-09 | Disposition: A | Discharge status: Home / Self Care | Payer: Managed Care, Other (non HMO) | Source: Ambulatory Visit | Attending: Gynecology | Admitting: Gynecology

## 2013-02-09 ENCOUNTER — Ambulatory Visit (INDEPENDENT_AMBULATORY_CARE_PROVIDER_SITE_OTHER): Payer: Managed Care, Other (non HMO) | Admitting: Women's Health

## 2013-02-09 VITALS — BP 110/62 | Ht 68.0 in | Wt 146.6 lb

## 2013-02-09 DIAGNOSIS — IMO0001 Reserved for inherently not codable concepts without codable children: Secondary | ICD-10-CM

## 2013-02-09 DIAGNOSIS — Z01419 Encounter for gynecological examination (general) (routine) without abnormal findings: Secondary | ICD-10-CM

## 2013-02-09 DIAGNOSIS — E079 Disorder of thyroid, unspecified: Secondary | ICD-10-CM

## 2013-02-09 DIAGNOSIS — Z309 Encounter for contraceptive management, unspecified: Secondary | ICD-10-CM

## 2013-02-09 LAB — CBC WITH DIFFERENTIAL/PLATELET
Basophils Absolute: 0 10*3/uL (ref 0.0–0.1)
Eosinophils Absolute: 0 10*3/uL (ref 0.0–0.7)
HCT: 40.9 % (ref 36.0–46.0)
Hemoglobin: 14.1 g/dL (ref 12.0–15.0)
Lymphocytes Relative: 34 % (ref 12–46)
Lymphs Abs: 1.9 10*3/uL (ref 0.7–4.0)
Monocytes Absolute: 0.7 10*3/uL (ref 0.1–1.0)
Monocytes Relative: 13 % — ABNORMAL HIGH (ref 3–12)
Neutro Abs: 2.8 10*3/uL (ref 1.7–7.7)
RBC: 4.6 MIL/uL (ref 3.87–5.11)
WBC: 5.4 10*3/uL (ref 4.0–10.5)

## 2013-02-09 LAB — TSH: TSH: 2.304 u[IU]/mL (ref 0.350–4.500)

## 2013-02-09 MED ORDER — NORGESTREL-ETHINYL ESTRADIOL 0.3-30 MG-MCG PO TABS: 1.0000 | ORAL_TABLET | Freq: Every day | ORAL | Status: DC

## 2013-02-09 NOTE — Progress Notes (Signed)
Erin Thompson 1975-12-13 [id number]    History:    The patient presents for annual exam.  Cycles every 3 months on Lo  ovral without complaint. Same partner. One abnormal Pap years ago with no treatment, normal Paps since. Scheduled for breast implants to be removed and replaced in 2 weeks. Positive Chlamydia 2012 with negative test of cure.   Past medical history, past surgical history, family history and social history were all reviewed and documented in the EPIC chart. [employer]. Right renal abscess 2013 with a normal MRI after. Parents diabetes and hypertension.   ROS:  A  ROS was performed and pertinent positives and negatives are included in the history.  Exam:  Filed Vitals:   02/09/13 1401  BP: 110/62    General appearance:  Normal Head/Neck:  Normal, without cervical or supraclavicular adenopathy. Thyroid:  Symmetrical, normal in size, without palpable masses or nodularity. Respiratory  Effort:  Normal  Auscultation:  Clear without wheezing or rhonchi Cardiovascular  Auscultation:  Regular rate, without rubs, murmurs or gallops  Edema/varicosities:  Not grossly evident Abdominal  Soft,nontender, without masses, guarding or rebound.  Liver/spleen:  No organomegaly noted  Hernia:  None appreciated  Skin  Inspection:  Grossly normal  Palpation:  Grossly normal Neurologic/psychiatric  Orientation:  Normal with appropriate conversation.  Mood/affect:  Normal  Genitourinary    Breasts: Examined lying and sitting/bilateral saline implants.     Right: Without masses, retractions, discharge or axillary adenopathy.     Left: Without masses, retractions, discharge or axillary adenopathy.   Inguinal/mons:  Normal without inguinal adenopathy  External genitalia:  Normal  BUS/Urethra/Skene's glands:  Normal  Bladder:  Normal  Vagina:  Normal  Cervix:  Normal  Uterus:   normal in size, shape and contour.  Midline and mobile  Adnexa/parametria:      Rt: Without masses or tenderness.   Lt: Without masses or tenderness.  Anus and perineum: Normal  Digital rectal exam: Normal sphincter tone without palpated masses or tenderness  Assessment/Plan:  37 y.o.SWF G0  for annual exam with complaint of decreased libido and always feeling warm.  Anxiety/depression Dr. Evelene Croon for meds and counseling. Decreased libido  Plan: Lo Ovral prescription, proper use given and reviewed takes continuously for 3 months. Risk for blood clots, strokes reviewed. Has tried loEstrin in the past but did not like. Libido reviewed, reviewed may BE medication related will discuss with psychiatrist. SBE's, regular exercise, calcium rich diet, MVI daily encouraged. CBC, rubella screen, glucose, TSH, UA, Pap    Harrington Challenger WHNP, 3:07 PM 02/09/2013

## 2013-02-09 NOTE — Patient Instructions (Signed)

## 2013-02-10 LAB — URINALYSIS W MICROSCOPIC + REFLEX CULTURE
Bilirubin Urine: NEGATIVE
Crystals: NONE SEEN
Glucose, UA: NEGATIVE mg/dL
Specific Gravity, Urine: 1.02 (ref 1.005–1.030)
Urobilinogen, UA: 0.2 mg/dL (ref 0.0–1.0)

## 2013-02-13 ENCOUNTER — Other Ambulatory Visit: Payer: Self-pay | Admitting: Women's Health

## 2013-02-13 LAB — URINE CULTURE: Colony Count: >=100000

## 2013-02-13 MED ORDER — CIPROFLOXACIN HCL 250 MG PO TABS: 250.0000 mg | ORAL_TABLET | Freq: Two times a day (BID) | ORAL | Status: DC

## 2013-12-22 ENCOUNTER — Other Ambulatory Visit: Payer: Self-pay

## 2014-02-13 ENCOUNTER — Ambulatory Visit (INDEPENDENT_AMBULATORY_CARE_PROVIDER_SITE_OTHER): Payer: Managed Care, Other (non HMO) | Admitting: Women's Health

## 2014-02-13 ENCOUNTER — Other Ambulatory Visit: Payer: Self-pay

## 2014-02-13 ENCOUNTER — Encounter: Payer: Self-pay | Admitting: Women's Health

## 2014-02-13 VITALS — BP 110/70 | Ht 69.0 in | Wt 141.0 lb

## 2014-02-13 DIAGNOSIS — Z01419 Encounter for gynecological examination (general) (routine) without abnormal findings: Secondary | ICD-10-CM

## 2014-02-13 DIAGNOSIS — Z3041 Encounter for surveillance of contraceptive pills: Secondary | ICD-10-CM

## 2014-02-13 LAB — CBC WITH DIFFERENTIAL/PLATELET
BASOS PCT: 1 % (ref 0–1)
Basophils Absolute: 0 10*3/uL (ref 0.0–0.1)
EOS ABS: 0.1 10*3/uL (ref 0.0–0.7)
Eosinophils Relative: 2 % (ref 0–5)
HCT: 43.8 % (ref 36.0–46.0)
Hemoglobin: 14.9 g/dL (ref 12.0–15.0)
Lymphocytes Relative: 42 % (ref 12–46)
Lymphs Abs: 1.8 10*3/uL (ref 0.7–4.0)
MCH: 30.6 pg (ref 26.0–34.0)
MCHC: 34 g/dL (ref 30.0–36.0)
MCV: 89.9 fL (ref 78.0–100.0)
MPV: 11 fL (ref 9.4–12.4)
Monocytes Absolute: 0.7 10*3/uL (ref 0.1–1.0)
Monocytes Relative: 16 % — ABNORMAL HIGH (ref 3–12)
Neutro Abs: 1.6 10*3/uL — ABNORMAL LOW (ref 1.7–7.7)
Neutrophils Relative %: 39 % — ABNORMAL LOW (ref 43–77)
Platelets: 257 10*3/uL (ref 150–400)
RBC: 4.87 MIL/uL (ref 3.87–5.11)
RDW: 13.2 % (ref 11.5–15.5)
WBC: 4.2 10*3/uL (ref 4.0–10.5)

## 2014-02-13 LAB — GLUCOSE, RANDOM: GLUCOSE: 91 mg/dL (ref 70–99)

## 2014-02-13 MED ORDER — NORGESTREL-ETHINYL ESTRADIOL 0.3-30 MG-MCG PO TABS: 1.0000 | ORAL_TABLET | Freq: Every day | ORAL | Status: DC

## 2014-02-13 NOTE — Progress Notes (Signed)
Erin Thompson [birth year] [id number]    History:    Presents for annual exam.  Monthly cycle on loovral without complaint. One abnormal Pap many years ago repeat Pap normal and normal since. 2013 right renal abscess with a normal MRI after. Anxiety and depression mons managed by Dr. Toy Care. Rubella positive.  Past medical history, past surgical history, family history and social history were all reviewed and documented in the EPIC chart. 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  Parents diabetes and hypertension.   ROS:  A  12 point ROS was performed and pertinent positives and negatives are included.  Exam:  Filed Vitals:   02/13/14 0842  BP: 110/70    General appearance:  Normal Thyroid:  Symmetrical, normal in size, without palpable masses or nodularity. Respiratory  Auscultation:  Clear without wheezing or rhonchi Cardiovascular  Auscultation:  Regular rate, without rubs, murmurs or gallops  Edema/varicosities:  Not grossly evident Abdominal  Soft,nontender, without masses, guarding or rebound.  Liver/spleen:  No organomegaly noted  Hernia:  None appreciated  Skin  Inspection:  Grossly normal   Breasts: Examined lying and sitting/bilateral implants.     Right: Without masses, retractions, discharge or axillary adenopathy.     Left: Without masses, retractions, discharge or axillary adenopathy. Gentitourinary   Inguinal/mons:  Normal without inguinal adenopathy  External genitalia:  Normal  BUS/Urethra/Skene's glands:  Normal  Vagina:  Normal  Cervix:  Normal  Uterus:   normal in size, shape and contour.  Midline and mobile  Adnexa/parametria:     Rt: Without masses or tenderness.   Lt: Without masses or tenderness.  Anus and perineum: Normal  Digital rectal exam: Normal sphincter tone without palpated masses or tenderness  Assessment/Plan:  38 y.o. SWF G0 for annual exwith no complaints.  Normal GYN exam on  loovral Anxiety/depression Dr. Toy Care manages labs   Plan: Lo/Ovral prescription, proper use, slight risk for blood clots and strokes reviewed. Planning pregnancy in next year or so, instructed to return to office for viability dating ultrasound aware we no longer deliver. SBE's, healthy pregnancy behaviors reviewed, calcium rich diet, prenatal vitamin daily encouraged. CBC, glucose, UA, Pap normal 2014, new screening guidelines reviewed. Currently on Pristiq for depression reviewed decreasing dose prior to pregnancy, will discuss with psychiatrist.   Huel Cote [facility], 12:10 PM 02/13/2014

## 2014-02-13 NOTE — Patient Instructions (Signed)

## 2014-02-14 LAB — URINALYSIS W MICROSCOPIC + REFLEX CULTURE
BACTERIA UA: NONE SEEN
Bilirubin Urine: NEGATIVE
CASTS: NONE SEEN
Crystals: NONE SEEN
GLUCOSE, UA: NEGATIVE mg/dL
KETONES UR: NEGATIVE mg/dL
Leukocytes, UA: NEGATIVE
Nitrite: NEGATIVE
PH: 6 (ref 5.0–8.0)
PROTEIN: NEGATIVE mg/dL
Specific Gravity, Urine: 1.024 (ref 1.005–1.030)
Urobilinogen, UA: 0.2 mg/dL (ref 0.0–1.0)

## 2015-01-17 ENCOUNTER — Ambulatory Visit (INDEPENDENT_AMBULATORY_CARE_PROVIDER_SITE_OTHER): Payer: Managed Care, Other (non HMO) | Admitting: Women's Health

## 2015-01-17 ENCOUNTER — Encounter: Payer: Self-pay | Admitting: Women's Health

## 2015-01-17 VITALS — BP 128/80 | Ht 69.0 in | Wt 147.0 lb

## 2015-01-17 DIAGNOSIS — Z3201 Encounter for pregnancy test, result positive: Secondary | ICD-10-CM

## 2015-01-17 DIAGNOSIS — O3680X0 Pregnancy with inconclusive fetal viability, not applicable or unspecified: Secondary | ICD-10-CM

## 2015-01-17 DIAGNOSIS — N912 Amenorrhea, unspecified: Secondary | ICD-10-CM | POA: Diagnosis not present

## 2015-01-17 LAB — PREGNANCY, URINE: PREG TEST UR: POSITIVE — AB

## 2015-01-17 NOTE — Progress Notes (Signed)
Patient ID: Erin Thompson, female   DOB: Jun 11, 1975, 39 y.o.   MRN: [id number] Presents with positive home UPT. LMP October 22, normal cycle stopped OCs in July. Happy with pregnancy, taking multivitamin daily. Denies any abdominal pain, cramping, bleeding, discharge or urinary symptoms.  Exam: Appears well. Positive UPT  Early pregnancy  Plan: Schedule viability/dating ultrasound after December 5. Prenatal vitamin daily, safe pregnancy behaviors reviewed. Aware we no longer deliver. Congratulations given.

## 2015-01-17 NOTE — Patient Instructions (Signed)
First Trimester of Pregnancy The first trimester of pregnancy is from week 1 until the end of week 12 (months 1 through 3). A week after a sperm fertilizes an egg, the egg will implant on the wall of the uterus. This embryo will begin to develop into a baby. Genes from you and your partner are forming the baby. The female genes determine whether the baby is a boy or a girl. At 6-8 weeks, the eyes and face are formed, and the heartbeat can be seen on ultrasound. At the end of 12 weeks, all the baby's organs are formed.  Now that you are pregnant, you will want to do everything you can to have a healthy baby. Two of the most important things are to get good prenatal care and to follow your health care provider's instructions. Prenatal care is all the medical care you receive before the baby's birth. This care will help prevent, find, and treat any problems during the pregnancy and childbirth. BODY CHANGES Your body goes through many changes during pregnancy. The changes vary from woman to woman.   You may gain or lose a couple of pounds at first.  You may feel sick to your stomach (nauseous) and throw up (vomit). If the vomiting is uncontrollable, call your health care provider.  You may tire easily.  You may develop headaches that can be relieved by medicines approved by your health care provider.  You may urinate more often. Painful urination may mean you have a bladder infection.  You may develop heartburn as a result of your pregnancy.  You may develop constipation because certain hormones are causing the muscles that push waste through your intestines to slow down.  You may develop hemorrhoids or swollen, bulging veins (varicose veins).  Your breasts may begin to grow larger and become tender. Your nipples may stick out more, and the tissue that surrounds them (areola) may become darker.  Your gums may bleed and may be sensitive to brushing and flossing.  Dark spots or blotches (chloasma,  mask of pregnancy) may develop on your face. This will likely fade after the baby is born.  Your menstrual periods will stop.  You may have a loss of appetite.  You may develop cravings for certain kinds of food.  You may have changes in your emotions from day to day, such as being excited to be pregnant or being concerned that something may go wrong with the pregnancy and baby.  You may have more vivid and strange dreams.  You may have changes in your hair. These can include thickening of your hair, rapid growth, and changes in texture. Some women also have hair loss during or after pregnancy, or hair that feels dry or thin. Your hair will most likely return to normal after your baby is born. WHAT TO EXPECT AT YOUR PRENATAL VISITS During a routine prenatal visit:  You will be weighed to make sure you and the baby are growing normally.  Your blood pressure will be taken.  Your abdomen will be measured to track your baby's growth.  The fetal heartbeat will be listened to starting around week 10 or 12 of your pregnancy.  Test results from any previous visits will be discussed. Your health care provider may ask you:  How you are feeling.  If you are feeling the baby move.  If you have had any abnormal symptoms, such as leaking fluid, bleeding, severe headaches, or abdominal cramping.  If you are using any tobacco products,   including cigarettes, chewing tobacco, and electronic cigarettes.  If you have any questions. Other tests that may be performed during your first trimester include:  Blood tests to find your blood type and to check for the presence of any previous infections. They will also be used to check for low iron levels (anemia) and Rh antibodies. Later in the pregnancy, blood tests for diabetes will be done along with other tests if problems develop.  Urine tests to check for infections, diabetes, or protein in the urine.  An ultrasound to confirm the proper growth  and development of the baby.  An amniocentesis to check for possible genetic problems.  Fetal screens for spina bifida and Down syndrome.  You may need other tests to make sure you and the baby are doing well.  HIV (human immunodeficiency virus) testing. Routine prenatal testing includes screening for HIV, unless you choose not to have this test. HOME CARE INSTRUCTIONS  Medicines  Follow your health care provider's instructions regarding medicine use. Specific medicines may be either safe or unsafe to take during pregnancy.  Take your prenatal vitamins as directed.  If you develop constipation, try taking a stool softener if your health care provider approves. Diet  Eat regular, well-balanced meals. Choose a variety of foods, such as meat or vegetable-based protein, fish, milk and low-fat dairy products, vegetables, fruits, and whole grain breads and cereals. Your health care provider will help you determine the amount of weight gain that is right for you.  Avoid raw meat and uncooked cheese. These carry germs that can cause birth defects in the baby.  Eating four or five small meals rather than three large meals a day may help relieve nausea and vomiting. If you start to feel nauseous, eating a few soda crackers can be helpful. Drinking liquids between meals instead of during meals also seems to help nausea and vomiting.  If you develop constipation, eat more high-fiber foods, such as fresh vegetables or fruit and whole grains. Drink enough fluids to keep your urine clear or pale yellow. Activity and Exercise  Exercise only as directed by your health care provider. Exercising will help you:  Control your weight.  Stay in shape.  Be prepared for labor and delivery.  Experiencing pain or cramping in the lower abdomen or low back is a good sign that you should stop exercising. Check with your health care provider before continuing normal exercises.  Try to avoid standing for long  periods of time. Move your legs often if you must stand in one place for a long time.  Avoid heavy lifting.  Wear low-heeled shoes, and practice good posture.  You may continue to have sex unless your health care provider directs you otherwise. Relief of Pain or Discomfort  Wear a good support bra for breast tenderness.   Take warm sitz baths to soothe any pain or discomfort caused by hemorrhoids. Use hemorrhoid cream if your health care provider approves.   Rest with your legs elevated if you have leg cramps or low back pain.  If you develop varicose veins in your legs, wear support hose. Elevate your feet for 15 minutes, 3-4 times a day. Limit salt in your diet. Prenatal Care  Schedule your prenatal visits by the twelfth week of pregnancy. They are usually scheduled monthly at first, then more often in the last 2 months before delivery.  Write down your questions. Take them to your prenatal visits.  Keep all your prenatal visits as directed by your   health care provider. Safety  Wear your seat belt at all times when driving.  Make a list of emergency phone numbers, including numbers for family, friends, the hospital, and police and fire departments. General Tips  Ask your health care provider for a referral to a local prenatal education class. Begin classes no later than at the beginning of month 6 of your pregnancy.  Ask for help if you have counseling or nutritional needs during pregnancy. Your health care provider can offer advice or refer you to specialists for help with various needs.  Do not use hot tubs, steam rooms, or saunas.  Do not douche or use tampons or scented sanitary pads.  Do not cross your legs for long periods of time.  Avoid cat litter boxes and soil used by cats. These carry germs that can cause birth defects in the baby and possibly loss of the fetus by miscarriage or stillbirth.  Avoid all smoking, herbs, alcohol, and medicines not prescribed by  your health care provider. Chemicals in these affect the formation and growth of the baby.  Do not use any tobacco products, including cigarettes, chewing tobacco, and electronic cigarettes. If you need help quitting, ask your health care provider. You may receive counseling support and other resources to help you quit.  Schedule a dentist appointment. At home, brush your teeth with a soft toothbrush and be gentle when you floss. SEEK MEDICAL CARE IF:   You have dizziness.  You have mild pelvic cramps, pelvic pressure, or nagging pain in the abdominal area.  You have persistent nausea, vomiting, or diarrhea.  You have a bad smelling vaginal discharge.  You have pain with urination.  You notice increased swelling in your face, hands, legs, or ankles. SEEK IMMEDIATE MEDICAL CARE IF:   You have a fever.  You are leaking fluid from your vagina.  You have spotting or bleeding from your vagina.  You have severe abdominal cramping or pain.  You have rapid weight gain or loss.  You vomit blood or material that looks like coffee grounds.  You are exposed to German measles and have never had them.  You are exposed to fifth disease or chickenpox.  You develop a severe headache.  You have shortness of breath.  You have any kind of trauma, such as from a fall or a car accident.   This information is not intended to replace advice given to you by your health care provider. Make sure you discuss any questions you have with your health care provider.   Document Released: 02/17/2001 Document Revised: 03/16/2014 Document Reviewed: 01/03/2013 Elsevier Interactive Patient Education 2016 [employer]  

## 2015-01-23 ENCOUNTER — Telehealth: Payer: Self-pay | Admitting: *Deleted

## 2015-01-23 NOTE — Telephone Encounter (Signed)
Pt had OV with positive UPT on 01/17/15 called today c/o brown spotting while wiping only, no pain has light cramping, spotting started yesterday. I explained to patient no abnormal in pregnancy to have spotting, if heavy bleeding or pain should occur to make OV or be seen at ER if after hours. I told pt I will relay this to you as well to be aware if any other recommendations. Please advise

## 2015-01-23 NOTE — Telephone Encounter (Signed)
Patient aware Rx sent.  

## 2015-01-23 NOTE — Telephone Encounter (Signed)
Good advice, have her watch for now, pelvic rest/no intercourse. Keep scheduled follow-up ultrasound appointment. If any bright red bleeding call office.

## 2015-01-26 ENCOUNTER — Inpatient Hospital Stay (HOSPITAL_COMMUNITY): Payer: Managed Care, Other (non HMO)

## 2015-01-26 ENCOUNTER — Inpatient Hospital Stay (HOSPITAL_COMMUNITY)
Admission: AD | Admit: 2015-01-26 | Discharge: 2015-01-26 | Disposition: A | Discharge status: Home / Self Care | Payer: Managed Care, Other (non HMO) | Source: Ambulatory Visit | Attending: Obstetrics and Gynecology | Admitting: Obstetrics and Gynecology

## 2015-01-26 ENCOUNTER — Encounter (HOSPITAL_COMMUNITY): Payer: Self-pay | Admitting: *Deleted

## 2015-01-26 DIAGNOSIS — Z3A01 Less than 8 weeks gestation of pregnancy: Secondary | ICD-10-CM | POA: Insufficient documentation

## 2015-01-26 DIAGNOSIS — O209 Hemorrhage in early pregnancy, unspecified: Secondary | ICD-10-CM

## 2015-01-26 DIAGNOSIS — O4691 Antepartum hemorrhage, unspecified, first trimester: Secondary | ICD-10-CM

## 2015-01-26 DIAGNOSIS — N939 Abnormal uterine and vaginal bleeding, unspecified: Secondary | ICD-10-CM | POA: Diagnosis not present

## 2015-01-26 DIAGNOSIS — O3680X0 Pregnancy with inconclusive fetal viability, not applicable or unspecified: Secondary | ICD-10-CM

## 2015-01-26 LAB — CREATININE, SERUM
CREATININE: 0.79 mg/dL (ref 0.44–1.00)
GFR calc non Af Amer: >60 mL/min (ref >60)

## 2015-01-26 LAB — CBC
HCT: 41.5 % (ref 36.0–46.0)
Hemoglobin: 14 g/dL (ref 12.0–15.0)
MCH: 30.6 pg (ref 26.0–34.0)
MCHC: 33.7 g/dL (ref 30.0–36.0)
MCV: 90.8 fL (ref 78.0–100.0)
PLATELETS: 215 10*3/uL (ref 150–400)
RBC: 4.57 MIL/uL (ref 3.87–5.11)
RDW: 12.7 % (ref 11.5–15.5)
WBC: 6.2 10*3/uL (ref 4.0–10.5)

## 2015-01-26 LAB — AST: AST: 25 U/L (ref 15–41)

## 2015-01-26 LAB — HCG, QUANTITATIVE, PREGNANCY: hCG, Beta Chain, Quant, S: 3988 m[IU]/mL — ABNORMAL HIGH (ref <5)

## 2015-01-26 LAB — BUN: BUN: 16 mg/dL (ref 6–20)

## 2015-01-26 LAB — ABO/RH: ABO/RH(D): O POS

## 2015-01-26 NOTE — Discharge Instructions (Signed)
Threatened Miscarriage A threatened miscarriage occurs when you have vaginal bleeding during your first 20 weeks of pregnancy but the pregnancy has not ended. If you have vaginal bleeding during this time, your health care provider will do tests to make sure you are still pregnant. If the tests show you are still pregnant and the developing baby (fetus) inside your womb (uterus) is still growing, your condition is considered a threatened miscarriage. A threatened miscarriage does not mean your pregnancy will end, but it does increase the risk of losing your pregnancy (complete miscarriage). CAUSES  The cause of a threatened miscarriage is usually not known. If you go on to have a complete miscarriage, the most common cause is an abnormal number of chromosomes in the developing baby. Chromosomes are the structures inside cells that hold all your genetic material. Some causes of vaginal bleeding that do not result in miscarriage include:  Having sex.  Having an infection.  Normal hormone changes of pregnancy.  Bleeding that occurs when an egg implants in your uterus. RISK FACTORS Risk factors for bleeding in early pregnancy include:  Obesity.  Smoking.  Drinking excessive amounts of alcohol or caffeine.  Recreational drug use. SIGNS AND SYMPTOMS  Light vaginal bleeding.  Mild abdominal pain or cramps. DIAGNOSIS  If you have bleeding with or without abdominal pain before 20 weeks of pregnancy, your health care provider will do tests to check whether you are still pregnant. One important test involves using sound waves and a computer (ultrasound) to create images of the inside of your uterus. Other tests include an internal exam of your vagina and uterus (pelvic exam) and measurement of your baby's heart rate.  You may be diagnosed with a threatened miscarriage if:  Ultrasound testing shows you are still pregnant.  Your baby's heart rate is strong.  A pelvic exam shows that the  opening between your uterus and your vagina (cervix) is closed.  Your heart rate and blood pressure are stable.  Blood tests confirm you are still pregnant. TREATMENT  No treatments have been shown to prevent a threatened miscarriage from going on to a complete miscarriage. However, the right home care is important.  HOME CARE INSTRUCTIONS   Make sure you keep all your appointments for prenatal care. This is very important.  Get plenty of rest.  Do not have sex or use tampons if you have vaginal bleeding.  Do not douche.  Do not smoke or use recreational drugs.  Do not drink alcohol.  Avoid caffeine. SEEK MEDICAL CARE IF:  You have light vaginal bleeding or spotting while pregnant.  You have abdominal pain or cramping.  You have a fever. SEEK IMMEDIATE MEDICAL CARE IF:  You have heavy vaginal bleeding.  You have blood clots coming from your vagina.  You have severe low back pain or abdominal cramps.  You have fever, chills, and severe abdominal pain. MAKE SURE YOU:  Understand these instructions.  Will watch your condition.  Will get help right away if you are not doing well or get worse.   This information is not intended to replace advice given to you by your health care provider. Make sure you discuss any questions you have with your health care provider.   Document Released: 02/23/2005 Document Revised: 02/28/2013 Document Reviewed: 12/20/2012 Elsevier Interactive Patient Education 2016 Elsevier Inc[employer] Ectopic Pregnancy An ectopic pregnancy is when the fertilized egg attaches (implants) outside the uterus. Most ectopic pregnancies occur in the fallopian tube. Rarely do ectopic pregnancies occur  on the ovary, intestine, pelvis, or cervix. In an ectopic pregnancy, the fertilized egg does not have the ability to develop into a normal, healthy baby.  A ruptured ectopic pregnancy is one in which the fallopian tube gets torn or bursts and results in internal bleeding.  Often there is intense abdominal pain, and sometimes, vaginal bleeding. Having an ectopic pregnancy can be life threatening. If left untreated, this dangerous condition can lead to a blood transfusion, abdominal surgery, or even death. CAUSES  Damage to the fallopian tubes is the suspected cause in most ectopic pregnancies.  RISK FACTORS Depending on your circumstances, the risk of having an ectopic pregnancy will vary. The level of risk can be divided into three categories. High Risk  You have gone through infertility treatment.  You have had a previous ectopic pregnancy.  You have had previous tubal surgery.  You have had previous surgery to have the fallopian tubes tied (tubal ligation).  You have tubal problems or diseases.  You have been exposed to DES. DES is a medicine that was used until 1971 and had effects on babies whose mothers took the medicine.  You become pregnant while using an intrauterine device (IUD) for birth control. Moderate Risk  You have a history of infertility.  You have a history of a sexually transmitted infection (STI).  You have a history of pelvic inflammatory disease (PID).  You have scarring from endometriosis.  You have multiple sexual partners.  You smoke. Low Risk  You have had previous pelvic surgery.  You use vaginal douching.  You became sexually active before 37 years of age. SIGNS AND SYMPTOMS  An ectopic pregnancy should be suspected in anyone who has missed a period and has abdominal pain or bleeding.  You may experience normal pregnancy symptoms, such as:  Nausea.  Tiredness.  Breast tenderness.  Other symptoms may include:  Pain with intercourse.  Irregular vaginal bleeding or spotting.  Cramping or pain on one side or in the lower abdomen.  Fast heartbeat.  Passing out while having a bowel movement.  Symptoms of a ruptured ectopic pregnancy and internal bleeding may include:  Sudden, severe pain in the  abdomen and pelvis.  Dizziness or fainting.  Pain in the shoulder area. DIAGNOSIS  Tests that may be performed include:  A pregnancy test.  An ultrasound test.  Testing the specific level of pregnancy hormone in the bloodstream.  Taking a sample of uterus tissue (dilation and curettage, D&C).  Surgery to perform a visual exam of the inside of the abdomen using a thin, lighted tube with a tiny camera on the end (laparoscope). TREATMENT  An injection of a medicine called methotrexate may be given. This medicine causes the pregnancy tissue to be absorbed. It is given if:  The diagnosis is made early.  The fallopian tube has not ruptured.  You are considered to be a good candidate for the medicine. Usually, pregnancy hormone blood levels are checked after methotrexate treatment. This is to be sure the medicine is effective. It may take 4-6 weeks for the pregnancy to be absorbed (though most pregnancies will be absorbed by 3 weeks). Surgical treatment may be needed. A laparoscope may be used to remove the pregnancy tissue. If severe internal bleeding occurs, a cut (incision) may be made in the lower abdomen (laparotomy), and the ectopic pregnancy is removed. This stops the bleeding. Part of the fallopian tube, or the whole tube, may be removed as well (salpingectomy). After surgery, pregnancy hormone tests  may be done to be sure there is no pregnancy tissue left. You may receive a Rho (D) immune globulin shot if you are Rh negative and the father is Rh positive, or if you do not know the Rh type of the father. This is to prevent problems with any future pregnancy. SEEK IMMEDIATE MEDICAL CARE IF:  You have any symptoms of an ectopic pregnancy. This is a medical emergency. MAKE SURE YOU:  Understand these instructions.  Will watch your condition.  Will get help right away if you are not doing well or get worse.   This information is not intended to replace advice given to you by your  health care provider. Make sure you discuss any questions you have with your health care provider.   Document Released: 04/02/2004 Document Revised: 03/16/2014 Document Reviewed: 09/22/2012 Elsevier Interactive Patient Education [employer].

## 2015-01-26 NOTE — ED Notes (Signed)
GYNECOLOGY  VISIT   HPI: 39 y.o.   Married  Caucasian  female   G1P0 with Patient's last menstrual period was 12/24/2014.   here at [redacted]w[redacted]d weeks c/o vaginal bleeding. She has been spotting all week, this morning she had intercourse and has had increased bleeding today. Not filling up a pad, but fills the toilet with red blood when she goes to the bathroom. She has had intermittent menstrual type cramps, currently feeling a little better.  No h/o infertility or STD's.     GYNECOLOGIC HISTORY: Patient's last menstrual period was 12/24/2014. Contraception: none, she wasn't actively attempting pregnancy, but wasn't preventing.        OB History    Gravida Para Term Preterm AB TAB SAB Ectopic Multiple Living   1 0                 Patient Active Problem List   Diagnosis Date Noted  . Anxiety and depression 12/28/2011  . Renal abscess, right 12/28/2011  . DUB (dysfunctional uterine bleeding) 12/28/2011    History reviewed. No pertinent past medical history.  Past Surgical History  Procedure Laterality Date  . Hernia repair      AT BIRTH  . Breast surgery      Bi-lat Implants    No current facility-administered medications for this encounter.     ALLERGIES: Review of patient's allergies indicates no known allergies.  Family History  Problem Relation Age of Onset  . Hypertension Mother   . Diabetes Mother   . Diabetes Maternal Grandmother   . Cancer Maternal Grandmother     Thyroid  . Diabetes Father   . Hypertension Father     Social History   Social History  . Marital Status: Married    Spouse Name: N/A  . Number of Children: N/A  . Years of Education: N/A   Occupational History  . Not on file.   Social History Main Topics  . Smoking status: Never Smoker   . Smokeless tobacco: Never Used  . Alcohol Use: 0.0 oz/week    0 Standard drinks or equivalent per week     Comment: Rare  . Drug Use: No  . Sexual Activity:    Partners: Male    Birth Control/  Protection: Pill   Other Topics Concern  . Not on file   Social History Narrative    ROS: + only for bleeding and menstrual cramps  PHYSICAL EXAMINATION:    BP 132/75 mmHg  Pulse 67  Temp(Src) 98.2 F (36.8 C) (Oral)  Resp 18  Ht 5\' 9"  (1.753 m)  Wt 148 lb 3.2 oz (67.223 kg)  BMI 21.88 kg/m2  LMP 12/24/2014    General appearance: alert, cooperative and appears stated age Abdomen: soft, non-tender; bowel sounds normal; no masses,  no organomegaly  Pelvic: Per NP small amount of brown blood in the vagina, no active bleeding. Slight tenderness in the right adnexal region  Results for orders placed or performed during the hospital encounter of 01/26/15 (from the past 24 hour(s))  CBC     Status: None   Collection Time: 01/26/15  1:49 PM  Result Value Ref Range   WBC 6.2 4.0 - 10.5 K/uL   RBC 4.57 3.87 - 5.11 MIL/uL   Hemoglobin 14.0 12.0 - 15.0 g/dL   HCT 41.5 36.0 - 46.0 %   MCV 90.8 78.0 - 100.0 fL   MCH 30.6 26.0 - 34.0 pg   MCHC 33.7 30.0 - 36.0 g/dL  RDW 12.7 11.5 - 15.5 %   Platelets 215 150 - 400 K/uL  ABO/Rh     Status: None (Preliminary result)   Collection Time: 01/26/15  1:49 PM  Result Value Ref Range   ABO/RH(D) O POS   hCG, quantitative, pregnancy     Status: Abnormal   Collection Time: 01/26/15  1:49 PM  Result Value Ref Range   hCG, Beta Chain, Quant, S 3988 (H) <5 mIU/mL  AST     Status: None   Collection Time: 01/26/15  1:49 PM  Result Value Ref Range   AST 25 15 - 41 U/L  BUN     Status: None   Collection Time: 01/26/15  1:49 PM  Result Value Ref Range   BUN 16 6 - 20 mg/dL  Creatinine, serum     Status: None   Collection Time: 01/26/15  1:49 PM  Result Value Ref Range   Creatinine, Ser 0.79 0.44 - 1.00 mg/dL   GFR calc non Af Amer >60 >60 mL/min   GFR calc Af Amer >60 >60 mL/min   US Ob Comp Less 14 Wks  01/26/2015  CLINICAL DATA:  Abnormal uterine bleeding. EXAM: OBSTETRIC <14 WK [place] AND TRANSVAGINAL OB US TECHNIQUE: Both transabdominal  and transvaginal ultrasound examinations were performed for complete evaluation of the gestation as well as the maternal uterus, adnexal regions, and pelvic cul-de-sac. Transvaginal technique was performed to assess early pregnancy. COMPARISON:  None. FINDINGS: Intrauterine gestational sac: None Yolk sac:  No Embryo:  No Cardiac Activity: No Maternal uterus/adnexae: Heterogeneous endometrium measuring 8.6 mm in thickness. Ovaries are normal. No free fluid. IMPRESSION: Normal exam. No intrauterine pregnancy. No adnexal masses. No free fluid. Electronically Signed   By: Lorriane Shire M.D.   On: 01/26/2015 14:50   US Ob Transvaginal  01/26/2015  CLINICAL DATA:  Abnormal uterine bleeding. EXAM: OBSTETRIC <14 WK [place] AND TRANSVAGINAL OB US TECHNIQUE: Both transabdominal and transvaginal ultrasound examinations were performed for complete evaluation of the gestation as well as the maternal uterus, adnexal regions, and pelvic cul-de-sac. Transvaginal technique was performed to assess early pregnancy. COMPARISON:  None. FINDINGS: Intrauterine gestational sac: None Yolk sac:  No Embryo:  No Cardiac Activity: No Maternal uterus/adnexae: Heterogeneous endometrium measuring 8.6 mm in thickness. Ovaries are normal. No free fluid. IMPRESSION: Normal exam. No intrauterine pregnancy. No adnexal masses. No free fluid. Electronically Signed   By: Lorriane Shire M.D.   On: 01/26/2015 14:50     ASSESSMENT First trimester bleeding, BhcG 3988, no abnormalities or IUP noted on u/s. No ectopic risk factors. The patient has a benign exam, only intermittent menstrual cramps. Suspect SAB. Discussed with the patient and her husband a viable IUP is very unlikely given her BhcG and ultrasound findings. Discussed the unlikely scenario with twins the BhcG could be higher than expected with no findings on U/S. Discussed the risk of ectopic and the life threatening situation with an ectopic pregnancy  PLAN She will return in 48 hours for  another BhcG, further plans based on the results The patient is to call if she is saturating >1pad/hour or if she is having more than menstrual like cramps   An After Visit Summary was printed and given to the patient.  20 minutes face to face time of which over 50% was spent in counseling.

## 2015-01-26 NOTE — MAU Note (Signed)
abd cramping and bleeding - pretty bad, now.  Has been spotting for a wk.  Call MD was told the spotting was ok, now is heavy so she came straight in .

## 2015-01-26 NOTE — MAU Provider Note (Signed)
History     CSN: [id number]  Arrival date and time: 01/26/15 1324   First Provider Initiated Contact with Patient 01/26/15 1414         Chief Complaint  Patient presents with  . Abdominal Pain  . Vaginal Bleeding   HPI  Erin Thompson is a 39 y.o. G1P0 at [redacted]w[redacted]d who presents with abdominal pain & vaginal bleeding.  Recently found out pregnant.  Brown spotting x 1 week. Called the office & was told to come in if bleeding was bright red or heavier.  Had intercourse this morning. Since then has had increase in bright red bleeding with small clots & abdominal cramping. Currently rates pain 4/10 & says it has somewhat improved since this morning with no treatment.  Denies n/v/d/constipation.  Denies fever or urinary complaints.   OB History    Gravida Para Term Preterm AB TAB SAB Ectopic Multiple Living   1 0              History reviewed. No pertinent past medical history.  Past Surgical History  Procedure Laterality Date  . Hernia repair      AT BIRTH  . Breast surgery      Bi-lat Implants    Family History  Problem Relation Age of Onset  . Hypertension Mother   . Diabetes Mother   . Diabetes Maternal Grandmother   . Cancer Maternal Grandmother     Thyroid  . Diabetes Father   . Hypertension Father     Social History  Substance Use Topics  . Smoking status: Never Smoker   . Smokeless tobacco: Never Used  . Alcohol Use: 0.0 oz/week    0 Standard drinks or equivalent per week     Comment: Rare    Allergies: No Known Allergies  Prescriptions prior to admission  Medication Sig Dispense Refill Last Dose  . Prenatal Vit-Fe Fumarate-FA (PRENATAL MULTIVITAMIN) TABS tablet Take 1 tablet by mouth daily at 12 noon.   01/25/2015 at Unknown time    Review of Systems  Constitutional: Negative.   Gastrointestinal: Positive for abdominal pain. Negative for nausea, vomiting, diarrhea and constipation.  Genitourinary: Negative for dysuria.       + vaginal bleeding    Physical Exam   Blood pressure 132/75, pulse 67, temperature 98.2 F (36.8 C), temperature source Oral, resp. rate 18, height 5\' 9"  (1.753 m), weight 148 lb 3.2 oz (67.223 kg), last menstrual period 12/24/2014.  Physical Exam  Nursing note and vitals reviewed. Constitutional: She is oriented to person, place, and time. She appears well-developed and well-nourished. No distress.  HENT:  Head: Normocephalic and atraumatic.  Eyes: Conjunctivae are normal. Right eye exhibits no discharge. Left eye exhibits no discharge. No scleral icterus.  Neck: Normal range of motion.  Cardiovascular: Normal rate, regular rhythm and normal heart sounds.   No murmur heard. Respiratory: Effort normal and breath sounds normal. No respiratory distress. She has no wheezes.  GI: Soft. Bowel sounds are normal. She exhibits no distension. There is no tenderness.  Genitourinary: Vagina normal and uterus normal. Cervix exhibits no motion tenderness and no friability. Right adnexum displays tenderness. Right adnexum displays no mass and no fullness. Left adnexum displays no mass, no tenderness and no fullness.  Small amount of dark red blood slowly oozing from os.  Dark red blood cleared out of canal with 2 fox swabs.  Cervix closed.   Neurological: She is alert and oriented to person, place, and time.  Skin: Skin  is warm and dry. She is not diaphoretic.  Psychiatric: She has a normal mood and affect. Her behavior is normal. Judgment and thought content normal.    MAU Course  Procedures Results for orders placed or performed during the hospital encounter of 01/26/15 (from the past 24 hour(s))  CBC     Status: None   Collection Time: 01/26/15  1:49 PM  Result Value Ref Range   WBC 6.2 4.0 - 10.5 K/uL   RBC 4.57 3.87 - 5.11 MIL/uL   Hemoglobin 14.0 12.0 - 15.0 g/dL   HCT 41.5 36.0 - 46.0 %   MCV 90.8 78.0 - 100.0 fL   MCH 30.6 26.0 - 34.0 pg   MCHC 33.7 30.0 - 36.0 g/dL   RDW 12.7 11.5 - 15.5 %    Platelets 215 150 - 400 K/uL  ABO/Rh     Status: None (Preliminary result)   Collection Time: 01/26/15  1:49 PM  Result Value Ref Range   ABO/RH(D) O POS   hCG, quantitative, pregnancy     Status: Abnormal   Collection Time: 01/26/15  1:49 PM  Result Value Ref Range   hCG, Beta Chain, Quant, S 3988 (H) <5 mIU/mL   US Ob Comp Less 14 Wks  01/26/2015  CLINICAL DATA:  Abnormal uterine bleeding. EXAM: OBSTETRIC <14 WK [place] AND TRANSVAGINAL OB US TECHNIQUE: Both transabdominal and transvaginal ultrasound examinations were performed for complete evaluation of the gestation as well as the maternal uterus, adnexal regions, and pelvic cul-de-sac. Transvaginal technique was performed to assess early pregnancy. COMPARISON:  None. FINDINGS: Intrauterine gestational sac: None Yolk sac:  No Embryo:  No Cardiac Activity: No Maternal uterus/adnexae: Heterogeneous endometrium measuring 8.6 mm in thickness. Ovaries are normal. No free fluid. IMPRESSION: Normal exam. No intrauterine pregnancy. No adnexal masses. No free fluid. Electronically Signed   By: Lorriane Shire M.D.   On: 01/26/2015 14:50   US Ob Transvaginal  01/26/2015  CLINICAL DATA:  Abnormal uterine bleeding. EXAM: OBSTETRIC <14 WK [place] AND TRANSVAGINAL OB US TECHNIQUE: Both transabdominal and transvaginal ultrasound examinations were performed for complete evaluation of the gestation as well as the maternal uterus, adnexal regions, and pelvic cul-de-sac. Transvaginal technique was performed to assess early pregnancy. COMPARISON:  None. FINDINGS: Intrauterine gestational sac: None Yolk sac:  No Embryo:  No Cardiac Activity: No Maternal uterus/adnexae: Heterogeneous endometrium measuring 8.6 mm in thickness. Ovaries are normal. No free fluid. IMPRESSION: Normal exam. No intrauterine pregnancy. No adnexal masses. No free fluid. Electronically Signed   By: Lorriane Shire M.D.   On: 01/26/2015 14:50    MDM O positive 1511- S/w Dr. Talbert Nan. Informed of exam,  labs, & ultrasound. Will come to speak with patient.   Assessment and Plan  A: 1. Pregnancy of unknown anatomic location   2. Vaginal bleeding in pregnancy, first trimester    P: Discharge home Return to MAU in 48 hrs for [facility] Ectopic precautions given  Jorje Guild, NP  01/26/2015, 2:14 PM

## 2015-01-28 ENCOUNTER — Telehealth: Payer: Self-pay | Admitting: *Deleted

## 2015-01-28 ENCOUNTER — Inpatient Hospital Stay (HOSPITAL_COMMUNITY)
Admission: AD | Admit: 2015-01-28 | Discharge: 2015-01-28 | Disposition: A | Discharge status: Home / Self Care | Payer: Managed Care, Other (non HMO) | Source: Ambulatory Visit | Attending: Gynecology | Admitting: Gynecology

## 2015-01-28 DIAGNOSIS — O039 Complete or unspecified spontaneous abortion without complication: Secondary | ICD-10-CM

## 2015-01-28 LAB — HCG, QUANTITATIVE, PREGNANCY: HCG, BETA CHAIN, QUANT, S: 840 m[IU]/mL — AB (ref <5)

## 2015-01-28 NOTE — MAU Provider Note (Signed)
S: 39 y.o. G1P0 @[redacted]w[redacted]d  by LMP presents to MAU for repeat hcg.  She presented to MAU 01/26/15 with vaginal bleeding in early pregnancy. She denies abdominal pain or vaginal bleeding today.    Her quant hcg on 11/19 was 3899 and ultrasound showed no intrauterine or ectopic pregnancy.  HPI  O: LMP 12/24/2014  Nursing note reviewed,  Constitutional: well developed, well nourished, no distress HEENT: normocephalic CV: normal rate Pulm/chest wall: normal effort Abdomen: soft Neuro: alert and oriented x 3 Skin: warm, dry Psych: affect normal  Results for orders placed or performed during the hospital encounter of 01/28/15 (from the past 24 hour(s))  hCG, quantitative, pregnancy     Status: Abnormal   Collection Time: 01/28/15 12:58 PM  Result Value Ref Range   hCG, Beta Chain, Quant, S 840 (H) <5 mIU/mL    --/--/O POS (11/19 1349)  MDM: Ordered labs/reviewed results.  Quant hcg down significantly, consistent with SAB, likely complete.  Pt stable at time of discharge.  A: 1. SAB (spontaneous abortion)     P: D/C home with bleeding precautions F/U Dr Phineas Real in office in 1 week Return to MAU as needed for emergencies  LEFTWICH-KIRBY, Ginnie Marich, CNM 2:18 PM

## 2015-01-28 NOTE — Telephone Encounter (Signed)
[name] please see the below note. 

## 2015-01-28 NOTE — MAU Note (Signed)
Not in lobby #2 

## 2015-01-28 NOTE — Telephone Encounter (Signed)
Appointment made for Dec 7.

## 2015-01-28 NOTE — Discharge Instructions (Signed)
Miscarriage  A miscarriage is the sudden loss of an unborn baby (fetus) before the 20th week of pregnancy. Most miscarriages happen in the first 3 months of pregnancy. Sometimes, it happens before a woman even knows she is pregnant. A miscarriage is also called a "spontaneous miscarriage" or "early pregnancy loss." Having a miscarriage can be an emotional experience. Talk with your caregiver about any questions you may have about miscarrying, the grieving process, and your future pregnancy plans.  CAUSES    Problems with the fetal chromosomes that make it impossible for the baby to develop normally. Problems with the baby's genes or chromosomes are most often the result of errors that occur, by chance, as the embryo divides and grows. The problems are not inherited from the parents.   Infection of the cervix or uterus.    Hormone problems.    Problems with the cervix, such as having an incompetent cervix. This is when the tissue in the cervix is not strong enough to hold the pregnancy.    Problems with the uterus, such as an abnormally shaped uterus, uterine fibroids, or congenital abnormalities.    Certain medical conditions.    Smoking, drinking alcohol, or taking illegal drugs.    Trauma.   Often, the cause of a miscarriage is unknown.   SYMPTOMS    Vaginal bleeding or spotting, with or without cramps or pain.   Pain or cramping in the abdomen or lower back.   Passing fluid, tissue, or blood clots from the vagina.  DIAGNOSIS   Your caregiver will perform a physical exam. You may also have an ultrasound to confirm the miscarriage. Blood or urine tests may also be ordered.  TREATMENT    Sometimes, treatment is not necessary if you naturally pass all the fetal tissue that was in the uterus. If some of the fetus or placenta remains in the body (incomplete miscarriage), tissue left behind may become infected and must be removed. Usually, a dilation and curettage (D and C) procedure is performed.  During a D and C procedure, the cervix is widened (dilated) and any remaining fetal or placental tissue is gently removed from the uterus.   Antibiotic medicines are prescribed if there is an infection. Other medicines may be given to reduce the size of the uterus (contract) if there is a lot of bleeding.   If you have Rh negative blood and your baby was Rh positive, you will need a Rh immunoglobulin shot. This shot will protect any future baby from having Rh blood problems in future pregnancies.  HOME CARE INSTRUCTIONS    Your caregiver may order bed rest or may allow you to continue light activity. Resume activity as directed by your caregiver.   Have someone help with home and family responsibilities during this time.    Keep track of the number of sanitary pads you use each day and how soaked (saturated) they are. Write down this information.    Do not use tampons. Do not douche or have sexual intercourse until approved by your caregiver.    Only take over-the-counter or prescription medicines for pain or discomfort as directed by your caregiver.    Do not take aspirin. Aspirin can cause bleeding.    Keep all follow-up appointments with your caregiver.    If you or your partner have problems with grieving, talk to your caregiver or seek counseling to help cope with the pregnancy loss. Allow enough time to grieve before trying to get pregnant again.     SEEK IMMEDIATE MEDICAL CARE IF:    You have severe cramps or pain in your back or abdomen.   You have a fever.   You pass large blood clots (walnut-sized or larger) ortissue from your vagina. Save any tissue for your caregiver to inspect.    Your bleeding increases.    You have a thick, bad-smelling vaginal discharge.   You become lightheaded, weak, or you faint.    You have chills.   MAKE SURE YOU:   Understand these instructions.   Will watch your condition.   Will get help right away if you are not doing well or get worse.     This  information is not intended to replace advice given to you by your health care provider. Make sure you discuss any questions you have with your health care provider.     Document Released: 08/19/2000 Document Revised: 06/20/2012 Document Reviewed: 04/14/2011  Elsevier Interactive Patient Education 2016 [employer]

## 2015-01-28 NOTE — MAU Note (Signed)
Not in lobby

## 2015-01-28 NOTE — Telephone Encounter (Signed)
Needs ov only for lab, HCG. US done at hosp, no IUP seen.

## 2015-01-28 NOTE — Telephone Encounter (Signed)
-----   Message from Sinclair Grooms sent at 01/28/2015  2:41 PM EST ----- Regarding: ER Patient was seen this weekend for a missed AB. She was told to follow up with Erin Thompson next week. Can you please ask Erin Thompson if she just wants office visit or Ultrasound? Thx

## 2015-01-28 NOTE — MAU Note (Addendum)
Not in lobby #3, notified NP- NP had called and spoke with pt regarding lab results and plan

## 2015-02-05 ENCOUNTER — Ambulatory Visit: Payer: Managed Care, Other (non HMO) | Admitting: Women's Health

## 2015-02-06 ENCOUNTER — Ambulatory Visit (INDEPENDENT_AMBULATORY_CARE_PROVIDER_SITE_OTHER): Payer: Managed Care, Other (non HMO) | Admitting: Women's Health

## 2015-02-06 VITALS — BP 110/72

## 2015-02-06 DIAGNOSIS — O039 Complete or unspecified spontaneous abortion without complication: Secondary | ICD-10-CM

## 2015-02-06 LAB — CBC WITH DIFFERENTIAL/PLATELET
BASOS ABS: 0 10*3/uL (ref 0.0–0.1)
Basophils Relative: 0 % (ref 0–1)
EOS PCT: 1 % (ref 0–5)
Eosinophils Absolute: 0 10*3/uL (ref 0.0–0.7)
HEMATOCRIT: 41.3 % (ref 36.0–46.0)
HEMOGLOBIN: 13.7 g/dL (ref 12.0–15.0)
Lymphocytes Relative: 34 % (ref 12–46)
Lymphs Abs: 1.6 10*3/uL (ref 0.7–4.0)
MCH: 30.3 pg (ref 26.0–34.0)
MCHC: 33.2 g/dL (ref 30.0–36.0)
MCV: 91.4 fL (ref 78.0–100.0)
MONO ABS: 0.8 10*3/uL (ref 0.1–1.0)
MPV: 10.8 fL (ref 8.6–12.4)
Monocytes Relative: 16 % — ABNORMAL HIGH (ref 3–12)
NEUTROS ABS: 2.3 10*3/uL (ref 1.7–7.7)
Neutrophils Relative %: 49 % (ref 43–77)
PLATELETS: 234 10*3/uL (ref 150–400)
RBC: 4.52 MIL/uL (ref 3.87–5.11)
RDW: 12.9 % (ref 11.5–15.5)
WBC: 4.7 10*3/uL (ref 4.0–10.5)

## 2015-02-06 NOTE — Progress Notes (Signed)
Patient ID: Erin Thompson, female   DOB: Jul 30, 1975, 39 y.o.   MRN: [id number] Presents for follow-up from the hospital. Had heavy bleeding on 11/18 and 11/19  quantitative H CG 3988, ultrasound  showed an empty uterus, no adnexal masses. Quant on 11/21 of 840. O+ blood type. States no longer bleeding. Denies abdominal pain, discharge, states has had increased fatigue. Taking prenatal vitamin daily.  Exam: Appears well.  SAB  Plan: Quantitative hCG and CBC. Reviewed if hCG less than 5 , wait 2 cycles and then resume trying to conceive. Instructed to call if no cycle in December. Condolences given.

## 2015-02-06 NOTE — Patient Instructions (Signed)
Miscarriage  A miscarriage is the sudden loss of an unborn baby (fetus) before the 20th week of pregnancy. Most miscarriages happen in the first 3 months of pregnancy. Sometimes, it happens before a woman even knows she is pregnant. A miscarriage is also called a "spontaneous miscarriage" or "early pregnancy loss." Having a miscarriage can be an emotional experience. Talk with your caregiver about any questions you may have about miscarrying, the grieving process, and your future pregnancy plans.  CAUSES    Problems with the fetal chromosomes that make it impossible for the baby to develop normally. Problems with the baby's genes or chromosomes are most often the result of errors that occur, by chance, as the embryo divides and grows. The problems are not inherited from the parents.   Infection of the cervix or uterus.    Hormone problems.    Problems with the cervix, such as having an incompetent cervix. This is when the tissue in the cervix is not strong enough to hold the pregnancy.    Problems with the uterus, such as an abnormally shaped uterus, uterine fibroids, or congenital abnormalities.    Certain medical conditions.    Smoking, drinking alcohol, or taking illegal drugs.    Trauma.   Often, the cause of a miscarriage is unknown.   SYMPTOMS    Vaginal bleeding or spotting, with or without cramps or pain.   Pain or cramping in the abdomen or lower back.   Passing fluid, tissue, or blood clots from the vagina.  DIAGNOSIS   Your caregiver will perform a physical exam. You may also have an ultrasound to confirm the miscarriage. Blood or urine tests may also be ordered.  TREATMENT    Sometimes, treatment is not necessary if you naturally pass all the fetal tissue that was in the uterus. If some of the fetus or placenta remains in the body (incomplete miscarriage), tissue left behind may become infected and must be removed. Usually, a dilation and curettage (D and C) procedure is performed.  During a D and C procedure, the cervix is widened (dilated) and any remaining fetal or placental tissue is gently removed from the uterus.   Antibiotic medicines are prescribed if there is an infection. Other medicines may be given to reduce the size of the uterus (contract) if there is a lot of bleeding.   If you have Rh negative blood and your baby was Rh positive, you will need a Rh immunoglobulin shot. This shot will protect any future baby from having Rh blood problems in future pregnancies.  HOME CARE INSTRUCTIONS    Your caregiver may order bed rest or may allow you to continue light activity. Resume activity as directed by your caregiver.   Have someone help with home and family responsibilities during this time.    Keep track of the number of sanitary pads you use each day and how soaked (saturated) they are. Write down this information.    Do not use tampons. Do not douche or have sexual intercourse until approved by your caregiver.    Only take over-the-counter or prescription medicines for pain or discomfort as directed by your caregiver.    Do not take aspirin. Aspirin can cause bleeding.    Keep all follow-up appointments with your caregiver.    If you or your partner have problems with grieving, talk to your caregiver or seek counseling to help cope with the pregnancy loss. Allow enough time to grieve before trying to get pregnant again.     SEEK IMMEDIATE MEDICAL CARE IF:    You have severe cramps or pain in your back or abdomen.   You have a fever.   You pass large blood clots (walnut-sized or larger) ortissue from your vagina. Save any tissue for your caregiver to inspect.    Your bleeding increases.    You have a thick, bad-smelling vaginal discharge.   You become lightheaded, weak, or you faint.    You have chills.   MAKE SURE YOU:   Understand these instructions.   Will watch your condition.   Will get help right away if you are not doing well or get worse.     This  information is not intended to replace advice given to you by your health care provider. Make sure you discuss any questions you have with your health care provider.     Document Released: 08/19/2000 Document Revised: 06/20/2012 Document Reviewed: 04/14/2011  Elsevier Interactive Patient Education 2016 [employer]

## 2015-02-07 ENCOUNTER — Other Ambulatory Visit: Payer: Self-pay | Admitting: Women's Health

## 2015-02-07 DIAGNOSIS — O039 Complete or unspecified spontaneous abortion without complication: Secondary | ICD-10-CM

## 2015-02-07 LAB — HCG, QUANTITATIVE, PREGNANCY: hCG, Beta Chain, Quant, S: 259.1 m[IU]/mL — ABNORMAL HIGH

## 2015-02-14 ENCOUNTER — Other Ambulatory Visit: Payer: Managed Care, Other (non HMO)

## 2015-02-14 ENCOUNTER — Ambulatory Visit: Payer: Managed Care, Other (non HMO) | Admitting: Women's Health

## 2015-02-15 ENCOUNTER — Encounter: Payer: Managed Care, Other (non HMO) | Admitting: Women's Health

## 2015-02-15 ENCOUNTER — Other Ambulatory Visit: Payer: Managed Care, Other (non HMO)

## 2015-02-15 DIAGNOSIS — O039 Complete or unspecified spontaneous abortion without complication: Secondary | ICD-10-CM

## 2015-02-16 LAB — HCG, QUANTITATIVE, PREGNANCY: hCG, Beta Chain, Quant, S: 82.4 m[IU]/mL — ABNORMAL HIGH

## 2015-02-18 ENCOUNTER — Other Ambulatory Visit: Payer: Self-pay

## 2015-02-18 DIAGNOSIS — O039 Complete or unspecified spontaneous abortion without complication: Secondary | ICD-10-CM

## 2015-02-22 ENCOUNTER — Other Ambulatory Visit: Payer: Managed Care, Other (non HMO)

## 2015-02-22 DIAGNOSIS — O039 Complete or unspecified spontaneous abortion without complication: Secondary | ICD-10-CM

## 2015-02-23 LAB — HCG, QUANTITATIVE, PREGNANCY: hCG, Beta Chain, Quant, S: 15.3 m[IU]/mL — ABNORMAL HIGH

## 2015-02-25 ENCOUNTER — Other Ambulatory Visit: Payer: Self-pay | Admitting: Women's Health

## 2015-02-25 DIAGNOSIS — O039 Complete or unspecified spontaneous abortion without complication: Secondary | ICD-10-CM

## 2015-03-01 ENCOUNTER — Other Ambulatory Visit: Payer: Self-pay

## 2015-05-08 LAB — HM PAP SMEAR: HM Pap smear: ABNORMAL

## 2015-07-04 ENCOUNTER — Ambulatory Visit (INDEPENDENT_AMBULATORY_CARE_PROVIDER_SITE_OTHER): Payer: Managed Care, Other (non HMO) | Admitting: Women's Health

## 2015-07-04 ENCOUNTER — Encounter: Payer: Self-pay | Admitting: Women's Health

## 2015-07-04 VITALS — BP 118/80 | Ht 69.0 in | Wt 149.0 lb

## 2015-07-04 DIAGNOSIS — Z01419 Encounter for gynecological examination (general) (routine) without abnormal findings: Secondary | ICD-10-CM

## 2015-07-04 DIAGNOSIS — N926 Irregular menstruation, unspecified: Secondary | ICD-10-CM

## 2015-07-04 LAB — URINALYSIS W MICROSCOPIC + REFLEX CULTURE
BILIRUBIN URINE: NEGATIVE
CASTS: NONE SEEN [LPF]
CRYSTALS: NONE SEEN [HPF]
Glucose, UA: NEGATIVE
Hgb urine dipstick: NEGATIVE
KETONES UR: NEGATIVE
Leukocytes, UA: NEGATIVE
NITRITE: NEGATIVE
PROTEIN: NEGATIVE
RBC / HPF: NONE SEEN RBC/HPF (ref <=2)
SPECIFIC GRAVITY, URINE: 1.015 (ref 1.001–1.035)
WBC, UA: NONE SEEN WBC/HPF (ref <=5)
YEAST: NONE SEEN [HPF]
pH: 5.5 (ref 5.0–8.0)

## 2015-07-04 LAB — GLUCOSE, RANDOM: GLUCOSE: 95 mg/dL (ref 65–99)

## 2015-07-04 LAB — CBC WITH DIFFERENTIAL/PLATELET
BASOS ABS: 0 {cells}/uL (ref 0–200)
Basophils Relative: 0 %
EOS ABS: 44 {cells}/uL (ref 15–500)
Eosinophils Relative: 1 %
HCT: 40.3 % (ref 35.0–45.0)
HEMOGLOBIN: 13.4 g/dL (ref 11.7–15.5)
Lymphocytes Relative: 33 %
Lymphs Abs: 1452 cells/uL (ref 850–3900)
MCH: 30.5 pg (ref 27.0–33.0)
MCHC: 33.3 g/dL (ref 32.0–36.0)
MCV: 91.6 fL (ref 80.0–100.0)
MONO ABS: 528 {cells}/uL (ref 200–950)
MONOS PCT: 12 %
MPV: 10.8 fL (ref 7.5–12.5)
NEUTROS PCT: 54 %
Neutro Abs: 2376 cells/uL (ref 1500–7800)
PLATELETS: 207 10*3/uL (ref 140–400)
RBC: 4.4 MIL/uL (ref 3.80–5.10)
RDW: 13.5 % (ref 11.0–15.0)
WBC: 4.4 10*3/uL (ref 3.8–10.8)

## 2015-07-04 LAB — TSH: TSH: 1.68 mIU/L

## 2015-07-04 NOTE — Patient Instructions (Addendum)
Breast center [phone] Day 3-5 of next cycle lab appt for estradiol and [facility]  Health Maintenance, Female Adopting a healthy lifestyle and getting preventive care can go a long way to promote health and wellness. Talk with your health care provider about what schedule of regular examinations is right for you. This is a good chance for you to check in with your provider about disease prevention and staying healthy. In between checkups, there are plenty of things you can do on your own. Experts have done a lot of research about which lifestyle changes and preventive measures are most likely to keep you healthy. Ask your health care provider for more information. WEIGHT AND DIET  Eat a healthy diet  Be sure to include plenty of vegetables, fruits, low-fat dairy products, and lean protein.  Do not eat a lot of foods high in solid fats, added sugars, or salt.  Get regular exercise. This is one of the most important things you can do for your health.  Most adults should exercise for at least 150 minutes each week. The exercise should increase your heart rate and make you sweat (moderate-intensity exercise).  Most adults should also do strengthening exercises at least twice a week. This is in addition to the moderate-intensity exercise.  Maintain a healthy weight  Body mass index (BMI) is a measurement that can be used to identify possible weight problems. It estimates body fat based on height and weight. Your health care provider can help determine your BMI and help you achieve or maintain a healthy weight.  For females 72 years of age and older:   A BMI below 18.5 is considered underweight.  A BMI of 18.5 to 24.9 is normal.  A BMI of 25 to 29.9 is considered overweight.  A BMI of 30 and above is considered obese.  Watch levels of cholesterol and blood lipids  You should start having your blood tested for lipids and cholesterol at 38 years of age, then have this test every 5 years.  You  may need to have your cholesterol levels checked more often if:  Your lipid or cholesterol levels are high.  You are older than 38 years of age.  You are at high risk for heart disease.  CANCER SCREENING   Lung Cancer  Lung cancer screening is recommended for adults 75-54 years old who are at high risk for lung cancer because of a history of smoking.  A yearly low-dose CT scan of the lungs is recommended for people who:  Currently smoke.  Have quit within the past 15 years.  Have at least a 30-pack-year history of smoking. A pack year is smoking an average of one pack of cigarettes a day for 1 year.  Yearly screening should continue until it has been 15 years since you quit.  Yearly screening should stop if you develop a health problem that would prevent you from having lung cancer treatment.  Breast Cancer  Practice breast self-awareness. This means understanding how your breasts normally appear and feel.  It also means doing regular breast self-exams. Let your health care provider know about any changes, no matter how small.  If you are in your 20s or 30s, you should have a clinical breast exam (CBE) by a health care provider every 1-3 years as part of a regular health exam.  If you are 17 or older, have a CBE every year. Also consider having a breast X-ray (mammogram) every year.  If you have a family history  of breast cancer, talk to your health care provider about genetic screening.  If you are at high risk for breast cancer, talk to your health care provider about having an MRI and a mammogram every year.  Breast cancer gene (BRCA) assessment is recommended for women who have family members with BRCA-related cancers. BRCA-related cancers include:  Breast.  Ovarian.  Tubal.  Peritoneal cancers.  Results of the assessment will determine the need for genetic counseling and BRCA1 and BRCA2 testing. Cervical Cancer Your health care provider may recommend that you  be screened regularly for cancer of the pelvic organs (ovaries, uterus, and vagina). This screening involves a pelvic examination, including checking for microscopic changes to the surface of your cervix (Pap test). You may be encouraged to have this screening done every 3 years, beginning at age 66.  For women ages 97-65, health care providers may recommend pelvic exams and Pap testing every 3 years, or they may recommend the Pap and pelvic exam, combined with testing for human papilloma virus (HPV), every 5 years. Some types of HPV increase your risk of cervical cancer. Testing for HPV may also be done on women of any age with unclear Pap test results.  Other health care providers may not recommend any screening for nonpregnant women who are considered low risk for pelvic cancer and who do not have symptoms. Ask your health care provider if a screening pelvic exam is right for you.  If you have had past treatment for cervical cancer or a condition that could lead to cancer, you need Pap tests and screening for cancer for at least 20 years after your treatment. If Pap tests have been discontinued, your risk factors (such as having a new sexual partner) need to be reassessed to determine if screening should resume. Some women have medical problems that increase the chance of getting cervical cancer. In these cases, your health care provider may recommend more frequent screening and Pap tests. Colorectal Cancer  This type of cancer can be detected and often prevented.  Routine colorectal cancer screening usually begins at 38 years of age and continues through 38 years of age.  Your health care provider may recommend screening at an earlier age if you have risk factors for colon cancer.  Your health care provider may also recommend using home test kits to check for hidden blood in the stool.  A small camera at the end of a tube can be used to examine your colon directly (sigmoidoscopy or colonoscopy).  This is done to check for the earliest forms of colorectal cancer.  Routine screening usually begins at age 26.  Direct examination of the colon should be repeated every 5-10 years through 38 years of age. However, you may need to be screened more often if early forms of precancerous polyps or small growths are found. Skin Cancer  Check your skin from head to toe regularly.  Tell your health care provider about any new moles or changes in moles, especially if there is a change in a mole's shape or color.  Also tell your health care provider if you have a mole that is larger than the size of a pencil eraser.  Always use sunscreen. Apply sunscreen liberally and repeatedly throughout the day.  Protect yourself by wearing long sleeves, pants, a wide-brimmed hat, and sunglasses whenever you are outside. HEART DISEASE, DIABETES, AND HIGH BLOOD PRESSURE   High blood pressure causes heart disease and increases the risk of stroke. High blood pressure is  more likely to develop in:  People who have blood pressure in the high end of the normal range (130-139/85-89 mm Hg).  People who are overweight or obese.  People who are African American.  If you are 11-85 years of age, have your blood pressure checked every 3-5 years. If you are 84 years of age or older, have your blood pressure checked every year. You should have your blood pressure measured twice--once when you are at a hospital or clinic, and once when you are not at a hospital or clinic. Record the average of the two measurements. To check your blood pressure when you are not at a hospital or clinic, you can use:  An automated blood pressure machine at a pharmacy.  A home blood pressure monitor.  If you are between 23 years and 12 years old, ask your health care provider if you should take aspirin to prevent strokes.  Have regular diabetes screenings. This involves taking a blood sample to check your fasting blood sugar level.  If you  are at a normal weight and have a low risk for diabetes, have this test once every three years after 38 years of age.  If you are overweight and have a high risk for diabetes, consider being tested at a younger age or more often. PREVENTING INFECTION  Hepatitis B  If you have a higher risk for hepatitis B, you should be screened for this virus. You are considered at high risk for hepatitis B if:  You were born in a country where hepatitis B is common. Ask your health care provider which countries are considered high risk.  Your parents were born in a high-risk country, and you have not been immunized against hepatitis B (hepatitis B vaccine).  You have HIV or AIDS.  You use needles to inject street drugs.  You live with someone who has hepatitis B.  You have had sex with someone who has hepatitis B.  You get hemodialysis treatment.  You take certain medicines for conditions, including cancer, organ transplantation, and autoimmune conditions. Hepatitis C  Blood testing is recommended for:  Everyone born from [birth year] through 1963.  Anyone with known risk factors for hepatitis C. Sexually transmitted infections (STIs)  You should be screened for sexually transmitted infections (STIs) including gonorrhea and chlamydia if:  You are sexually active and are younger than 38 years of age.  You are older than 39 years of age and your health care provider tells you that you are at risk for this type of infection.  Your sexual activity has changed since you were last screened and you are at an increased risk for chlamydia or gonorrhea. Ask your health care provider if you are at risk.  If you do not have HIV, but are at risk, it may be recommended that you take a prescription medicine daily to prevent HIV infection. This is called pre-exposure prophylaxis (PrEP). You are considered at risk if:  You are sexually active and do not regularly use condoms or know the HIV status of your  partner(s).  You take drugs by injection.  You are sexually active with a partner who has HIV. Talk with your health care provider about whether you are at high risk of being infected with HIV. If you choose to begin PrEP, you should first be tested for HIV. You should then be tested every 3 months for as long as you are taking PrEP.  PREGNANCY   If you are premenopausal and you  may become pregnant, ask your health care provider about preconception counseling.  If you may become pregnant, take 400 to 800 micrograms (mcg) of folic acid every day.  If you want to prevent pregnancy, talk to your health care provider about birth control (contraception). OSTEOPOROSIS AND MENOPAUSE   Osteoporosis is a disease in which the bones lose minerals and strength with aging. This can result in serious bone fractures. Your risk for osteoporosis can be identified using a bone density scan.  If you are 75 years of age or older, or if you are at risk for osteoporosis and fractures, ask your health care provider if you should be screened.  Ask your health care provider whether you should take a calcium or vitamin D supplement to lower your risk for osteoporosis.  Menopause may have certain physical symptoms and risks.  Hormone replacement therapy may reduce some of these symptoms and risks. Talk to your health care provider about whether hormone replacement therapy is right for you.  HOME CARE INSTRUCTIONS   Schedule regular health, dental, and eye exams.  Stay current with your immunizations.   Do not use any tobacco products including cigarettes, chewing tobacco, or electronic cigarettes.  If you are pregnant, do not drink alcohol.  If you are breastfeeding, limit how much and how often you drink alcohol.  Limit alcohol intake to no more than 1 drink per day for nonpregnant women. One drink equals 12 ounces of beer, 5 ounces of wine, or 1 ounces of hard liquor.  Do not use street drugs.  Do  not share needles.  Ask your health care provider for help if you need support or information about quitting drugs.  Tell your health care provider if you often feel depressed.  Tell your health care provider if you have ever been abused or do not feel safe at home.   This information is not intended to replace advice given to you by your health care provider. Make sure you discuss any questions you have with your health care provider.   Document Released: 09/08/2010 Document Revised: 03/16/2014 Document Reviewed: 01/25/2013 Elsevier Interactive Patient Education [employer].

## 2015-07-04 NOTE — Progress Notes (Signed)
ALOMA ORDNER April 06, 1975 [id number]    History:    Presents for annual exam.  Monthly cycle desiring conception. Uses over-the-counter ovulation predictor kits which are positive and has not conceived in greater than 5 months using no contraception having frequent intercourse. Had  SAB 01/2015. History of anxiety and depression currently on no medication. 2013 renal abscess. Normal Pap history.  Past medical history, past surgical history, family history and social history were all reviewed and documented in the EPIC chart. Does some travel with job. Parents diabetes and hypertension.  ROS:  A ROS was performed and pertinent positives and negatives are included.  Exam:  Filed Vitals:   07/04/15 0900  BP: 118/80    General appearance:  Normal Thyroid:  Symmetrical, normal in size, without palpable masses or nodularity. Respiratory  Auscultation:  Clear without wheezing or rhonchi Cardiovascular  Auscultation:  Regular rate, without rubs, murmurs or gallops  Edema/varicosities:  Not grossly evident Abdominal  Soft,nontender, without masses, guarding or rebound.  Liver/spleen:  No organomegaly noted  Hernia:  None appreciated  Skin  Inspection:  Grossly normal   Breasts: Examined lying and sitting.     Right: Without masses, retractions, discharge or axillary adenopathy.     Left: Without masses, retractions, discharge or axillary adenopathy. Gentitourinary   Inguinal/mons:  Normal without inguinal adenopathy  External genitalia:  Normal  BUS/Urethra/Skene's glands:  Normal  Vagina:  Normal  Cervix:  Normal  Uterus:  normal in size, shape and contour.  Midline and mobile  Adnexa/parametria:     Rt: Without masses or tenderness.   Lt: Without masses or tenderness.  Anus and perineum: Normal  Digital rectal exam: Normal sphincter tone without palpated masses or tenderness  Assessment/Plan:  40 y.o. M WF G1 P0 for annual exam   with urinary frequency.  Regular monthly  cycle desiring conception History of anxiety and depression currently on no medication  Plan: Options reviewed will checks CBC, TSH, prolactin, progesterone (day 25) glucose, UA ( negative) Pap with HR HPV typing. Will return to office day 3-54 estradiol and FSH. Reviewed if labs all normal Will refer for fertility management. SBE's, baseline screening mammogram on cycle, cousin age 66 breast cancer deceased, paternal aunt age 25 breast cancer. Exercise, calcium rich diet, prenatal vitamin daily, safe pregnancy behaviors reviewed. Return to office with missed cycle for viability ultrasound.    Huel Cote [facility], 9:36 AM 07/04/2015

## 2015-07-04 NOTE — Addendum Note (Signed)
Addended by: Burnett Kanaris on: 07/04/2015 09:45 AM   Modules accepted: Orders

## 2015-07-05 LAB — HCG, QUANTITATIVE, PREGNANCY: hCG, Beta Chain, Quant, S: <2 m[IU]/mL

## 2015-07-05 LAB — PROGESTERONE: PROGESTERONE: 1.7 ng/mL

## 2015-07-05 LAB — PROLACTIN: Prolactin: 5.5 ng/mL

## 2015-07-06 LAB — URINE CULTURE
COLONY COUNT: NO GROWTH
ORGANISM ID, BACTERIA: NO GROWTH

## 2015-07-08 ENCOUNTER — Other Ambulatory Visit: Payer: Self-pay | Admitting: Women's Health

## 2015-07-08 ENCOUNTER — Other Ambulatory Visit: Payer: Managed Care, Other (non HMO)

## 2015-07-08 DIAGNOSIS — O039 Complete or unspecified spontaneous abortion without complication: Secondary | ICD-10-CM

## 2015-07-08 DIAGNOSIS — N926 Irregular menstruation, unspecified: Secondary | ICD-10-CM

## 2015-07-08 LAB — PAP, TP IMAGING W/ HPV RNA, RFLX HPV TYPE 16,18/45: HPV mRNA, High Risk: NOT DETECTED

## 2015-07-09 LAB — FOLLICLE STIMULATING HORMONE: FSH: 6.9 m[IU]/mL

## 2015-07-12 LAB — ESTRADIOL, FREE
Estradiol, Free: 0.86 pg/mL
Estradiol: 62 pg/mL

## 2015-07-26 ENCOUNTER — Ambulatory Visit (INDEPENDENT_AMBULATORY_CARE_PROVIDER_SITE_OTHER): Payer: Managed Care, Other (non HMO) | Admitting: Gynecology

## 2015-07-26 ENCOUNTER — Encounter: Payer: Self-pay | Admitting: Gynecology

## 2015-07-26 VITALS — BP 116/74

## 2015-07-26 DIAGNOSIS — R300 Dysuria: Secondary | ICD-10-CM | POA: Diagnosis not present

## 2015-07-26 DIAGNOSIS — N3000 Acute cystitis without hematuria: Secondary | ICD-10-CM | POA: Diagnosis not present

## 2015-07-26 LAB — URINALYSIS W MICROSCOPIC + REFLEX CULTURE
Bilirubin Urine: NEGATIVE
CASTS: NONE SEEN [LPF]
Crystals: NONE SEEN [HPF]
Glucose, UA: NEGATIVE
Ketones, ur: NEGATIVE
NITRITE: POSITIVE — AB
Protein, ur: NEGATIVE
SPECIFIC GRAVITY, URINE: 1.015 (ref 1.001–1.035)
YEAST: NONE SEEN [HPF]
pH: 8 (ref 5.0–8.0)

## 2015-07-26 MED ORDER — SULFAMETHOXAZOLE-TRIMETHOPRIM 800-160 MG PO TABS: 1.0000 | ORAL_TABLET | Freq: Two times a day (BID) | ORAL | Status: DC

## 2015-07-26 NOTE — Patient Instructions (Signed)
Take the antibiotic twice daily for 3 days.  Follow-up if your symptoms persist, worsen or recur. 

## 2015-07-26 NOTE — Progress Notes (Signed)
    Erin Thompson May 08, 1975 [id number]        40 y.o.  G1P0010 Presents with three-day history of frequency and dysuria. Slight urgency. No low back pain fever or chills. No vaginal discharge or itching odor or irritation. No nausea vomiting diarrhea constipation. History of UTIs in the past with what sounds like pyelonephritis several years ago.  Past medical history,surgical history, problem list, medications, allergies, family history and social history were all reviewed and documented in the EPIC chart.  Directed ROS with pertinent positives and negatives documented in the history of present illness/assessment and plan.  Exam: Filed Vitals:   07/26/15 1439  BP: 116/74   General appearance:  Normal Spine straight without CVA tenderness Abdomen soft nontender without masses guarding rebound  Assessment/Plan:  40 y.o. G1P0010 with history suspicious for UTI. Urinalysis appears contaminated with 20-40 squamous cells but does show 40-60 WBC and many bacteria. 3-10 RBC. Will cover for early UTI with Septra DS 1 by mouth twice a day 3 days. Will use OTC Azo-Standard. Will follow up if her symptoms persist, worsen or recur. Will check urine culture.    Anastasio Auerbach MD, 3:01 PM 07/26/2015

## 2015-07-29 LAB — URINE CULTURE

## 2015-08-06 ENCOUNTER — Telehealth: Payer: Self-pay | Admitting: *Deleted

## 2015-08-06 NOTE — Telephone Encounter (Signed)
Cramping in early pregnancy is very normal.  I would be glad to see her but there is not anything I could do at this point even if I knew she was threatening to miscarriage.  There is no interventions we can do to stop it.

## 2015-08-06 NOTE — Telephone Encounter (Signed)
Patient informed. She began to relay to me that she was seen at [facility] for first infertility visit. They checked her progesterone level and it was 12. She asked if she should have her progesterone level checked periodically. She is anxious because of previous miscarriage and strong desire to be pregnant. She had other questions and I suggested office visit to come and discuss with Dr. Loetta Rough. Appt made.

## 2015-08-06 NOTE — Telephone Encounter (Signed)
(  you are back up md) Pt called c/o cramping, LMP: 07/05/15, had positive upt this am, miscarriage last year, pt said cramps come and go, but can be intense cramps. She is not having any bleeding, pt is worried due to miscarriage last year. Are you okay with patient scheduling OV tomorrow or this afternoon? Please advise

## 2015-08-07 ENCOUNTER — Ambulatory Visit: Payer: Managed Care, Other (non HMO) | Admitting: Gynecology

## 2015-08-10 ENCOUNTER — Telehealth: Payer: Self-pay | Admitting: Gynecology

## 2015-08-10 NOTE — Telephone Encounter (Signed)
On Call Note:  [redacted] weeks pregnant and diagnosed with traveler's diarrhea by family MD and given Cipro prescription.  Wonders whether safe to take. Taking PO fluids well. Low grade temp 100.5 yesterday and took tylenol.  Recommended for now not to take the Cipro or OTC antidiarrheals given she is 5 weeks.  Risk vs benefit discussion.  Push fluids, tylenol for temps, avoid dehydration.  Call if symptoms persist or worsen and at that point we will consider medication treatment.

## 2015-08-12 ENCOUNTER — Inpatient Hospital Stay (HOSPITAL_COMMUNITY)
Admission: AD | Admit: 2015-08-12 | Discharge: 2015-08-12 | Disposition: A | Discharge status: Home / Self Care | Payer: Managed Care, Other (non HMO) | Source: Ambulatory Visit | Attending: Obstetrics & Gynecology | Admitting: Obstetrics & Gynecology

## 2015-08-12 ENCOUNTER — Encounter (HOSPITAL_COMMUNITY): Payer: Self-pay | Admitting: *Deleted

## 2015-08-12 ENCOUNTER — Inpatient Hospital Stay (HOSPITAL_COMMUNITY): Payer: Managed Care, Other (non HMO)

## 2015-08-12 DIAGNOSIS — O26899 Other specified pregnancy related conditions, unspecified trimester: Secondary | ICD-10-CM

## 2015-08-12 DIAGNOSIS — O26891 Other specified pregnancy related conditions, first trimester: Secondary | ICD-10-CM | POA: Diagnosis not present

## 2015-08-12 DIAGNOSIS — O468X1 Other antepartum hemorrhage, first trimester: Secondary | ICD-10-CM

## 2015-08-12 DIAGNOSIS — Z3A01 Less than 8 weeks gestation of pregnancy: Secondary | ICD-10-CM | POA: Diagnosis not present

## 2015-08-12 DIAGNOSIS — A09 Infectious gastroenteritis and colitis, unspecified: Secondary | ICD-10-CM | POA: Diagnosis not present

## 2015-08-12 DIAGNOSIS — E876 Hypokalemia: Secondary | ICD-10-CM

## 2015-08-12 DIAGNOSIS — O9989 Other specified diseases and conditions complicating pregnancy, childbirth and the puerperium: Secondary | ICD-10-CM

## 2015-08-12 DIAGNOSIS — O418X10 Other specified disorders of amniotic fluid and membranes, first trimester, not applicable or unspecified: Secondary | ICD-10-CM

## 2015-08-12 DIAGNOSIS — R109 Unspecified abdominal pain: Secondary | ICD-10-CM | POA: Insufficient documentation

## 2015-08-12 LAB — URINALYSIS, ROUTINE W REFLEX MICROSCOPIC
BILIRUBIN URINE: NEGATIVE
Glucose, UA: NEGATIVE mg/dL
KETONES UR: NEGATIVE mg/dL
Leukocytes, UA: NEGATIVE
NITRITE: NEGATIVE
PH: 6 (ref 5.0–8.0)
Protein, ur: NEGATIVE mg/dL
Specific Gravity, Urine: <1.005 — ABNORMAL LOW (ref 1.005–1.030)

## 2015-08-12 LAB — COMPREHENSIVE METABOLIC PANEL
ALT: 30 U/L (ref 14–54)
AST: 31 U/L (ref 15–41)
Albumin: 4.1 g/dL (ref 3.5–5.0)
Alkaline Phosphatase: 59 U/L (ref 38–126)
Anion gap: 7 (ref 5–15)
BILIRUBIN TOTAL: 0.4 mg/dL (ref 0.3–1.2)
BUN: 6 mg/dL (ref 6–20)
CALCIUM: 8.5 mg/dL — AB (ref 8.9–10.3)
CO2: 23 mmol/L (ref 22–32)
CREATININE: 0.8 mg/dL (ref 0.44–1.00)
Chloride: 103 mmol/L (ref 101–111)
Glucose, Bld: 96 mg/dL (ref 65–99)
Potassium: 3.2 mmol/L — ABNORMAL LOW (ref 3.5–5.1)
Sodium: 133 mmol/L — ABNORMAL LOW (ref 135–145)
TOTAL PROTEIN: 7.1 g/dL (ref 6.5–8.1)

## 2015-08-12 LAB — URINE MICROSCOPIC-ADD ON

## 2015-08-12 LAB — CBC
HCT: 37.7 % (ref 36.0–46.0)
Hemoglobin: 13.3 g/dL (ref 12.0–15.0)
MCH: 30.6 pg (ref 26.0–34.0)
MCHC: 35.3 g/dL (ref 30.0–36.0)
MCV: 86.9 fL (ref 78.0–100.0)
Platelets: 191 10*3/uL (ref 150–400)
RBC: 4.34 MIL/uL (ref 3.87–5.11)
RDW: 12.7 % (ref 11.5–15.5)
WBC: 4.3 10*3/uL (ref 4.0–10.5)

## 2015-08-12 LAB — POCT PREGNANCY, URINE: PREG TEST UR: POSITIVE — AB

## 2015-08-12 MED ORDER — POTASSIUM CHLORIDE ER 20 MEQ PO TBCR: 20.0000 meq | EXTENDED_RELEASE_TABLET | Freq: Every day | ORAL | Status: DC

## 2015-08-12 MED ORDER — RANITIDINE HCL 150 MG PO TABS: 150.0000 mg | ORAL_TABLET | Freq: Two times a day (BID) | ORAL | Status: DC

## 2015-08-12 MED ORDER — AZITHROMYCIN 250 MG PO TABS
1000.0000 mg | ORAL_TABLET | Freq: Once | ORAL | Status: AC
  Administered 2015-08-12: 1000 mg via ORAL
  Filled 2015-08-12: qty 4

## 2015-08-12 MED ORDER — POTASSIUM CHLORIDE CRYS ER 20 MEQ PO TBCR
40.0000 meq | EXTENDED_RELEASE_TABLET | Freq: Once | ORAL | Status: AC
  Administered 2015-08-12: 40 meq via ORAL
  Filled 2015-08-12: qty 2

## 2015-08-12 MED ORDER — FAMOTIDINE 20 MG PO TABS
40.0000 mg | ORAL_TABLET | Freq: Once | ORAL | Status: AC
  Administered 2015-08-12: 40 mg via ORAL
  Filled 2015-08-12: qty 2

## 2015-08-12 NOTE — Discharge Instructions (Signed)
Diarrhea Diarrhea is frequent loose and watery bowel movements. It can cause you to feel weak and dehydrated. Dehydration can cause you to become tired and thirsty, have a dry mouth, and have decreased urination that often is dark yellow. Diarrhea is a sign of another problem, most often an infection that will not last long. In most cases, diarrhea typically lasts 2-3 days. However, it can last longer if it is a sign of something more serious. It is important to treat your diarrhea as directed by your caregiver to lessen or prevent future episodes of diarrhea. CAUSES  Some common causes include:  Gastrointestinal infections caused by viruses, bacteria, or parasites.  Food poisoning or food allergies.  Certain medicines, such as antibiotics, chemotherapy, and laxatives.  Artificial sweeteners and fructose.  Digestive disorders. HOME CARE INSTRUCTIONS  Ensure adequate fluid intake (hydration): Have 1 cup (8 oz) of fluid for each diarrhea episode. Avoid fluids that contain simple sugars or sports drinks, fruit juices, whole milk products, and sodas. Your urine should be clear or pale yellow if you are drinking enough fluids. Hydrate with an oral rehydration solution that you can purchase at pharmacies, retail stores, and online. You can prepare an oral rehydration solution at home by mixing the following ingredients together:   - tsp table salt.   tsp baking soda.   tsp salt substitute containing potassium chloride.  1  tablespoons sugar.  1 L (34 oz) of water.  Certain foods and beverages may increase the speed at which food moves through the gastrointestinal (GI) tract. These foods and beverages should be avoided and include:  Caffeinated and alcoholic beverages.  High-fiber foods, such as raw fruits and vegetables, nuts, seeds, and whole grain breads and cereals.  Foods and beverages sweetened with sugar alcohols, such as xylitol, sorbitol, and mannitol.  Some foods may be well  tolerated and may help thicken stool including:  Starchy foods, such as rice, toast, pasta, low-sugar cereal, oatmeal, grits, baked potatoes, crackers, and bagels.  Bananas.  Applesauce.  Add probiotic-rich foods to help increase healthy bacteria in the GI tract, such as yogurt and fermented milk products.  Wash your hands well after each diarrhea episode.  Only take over-the-counter or prescription medicines as directed by your caregiver.  Take a warm bath to relieve any burning or pain from frequent diarrhea episodes. SEEK IMMEDIATE MEDICAL CARE IF:   You are unable to keep fluids down.  You have persistent vomiting.  You have blood in your stool, or your stools are black and tarry.  You do not urinate in 6-8 hours, or there is only a small amount of very dark urine.  You have abdominal pain that increases or localizes.  You have weakness, dizziness, confusion, or light-headedness.  You have a severe headache.  Your diarrhea gets worse or does not get better.  You have a fever or persistent symptoms for more than 2-3 days.  You have a fever and your symptoms suddenly get worse. MAKE SURE YOU:   Understand these instructions.  Will watch your condition.  Will get help right away if you are not doing well or get worse.   This information is not intended to replace advice given to you by your health care provider. Make sure you discuss any questions you have with your health care provider.   Document Released: 02/13/2002 Document Revised: 03/16/2014 Document Reviewed: 11/01/2011 Elsevier Interactive Patient Education 2016 [place] Hematoma A subchorionic hematoma is a gathering of blood between the  outer wall of the placenta and the inner wall of the womb (uterus). The placenta is the organ that connects the fetus to the wall of the uterus. The placenta performs the feeding, breathing (oxygen to the fetus), and waste removal (excretory work) of the  fetus.  Subchorionic hematoma is the most common abnormality found on a result from ultrasonography done during the first trimester or early second trimester of pregnancy. If there has been little or no vaginal bleeding, early small hematomas usually shrink on their own and do not affect your baby or pregnancy. The blood is gradually absorbed over 1-2 weeks. When bleeding starts later in pregnancy or the hematoma is larger or occurs in an older pregnant woman, the outcome may not be as good. Larger hematomas may get bigger, which increases the chances for miscarriage. Subchorionic hematoma also increases the risk of premature detachment of the placenta from the uterus, preterm (premature) labor, and stillbirth. HOME CARE INSTRUCTIONS  Stay on bed rest if your health care provider recommends this. Although bed rest will not prevent more bleeding or prevent a miscarriage, your health care provider may recommend bed rest until you are advised otherwise.  Avoid heavy lifting (more than 10 lb [4.5 kg]), exercise, sexual intercourse, or douching as directed by your health care provider.  Keep track of the number of pads you use each day and how soaked (saturated) they are. Write down this information.  Do not use tampons.  Keep all follow-up appointments as directed by your health care provider. Your health care provider may ask you to have follow-up blood tests or ultrasound tests or both. SEEK IMMEDIATE MEDICAL CARE IF:  You have severe cramps in your stomach, back, abdomen, or pelvis.  You have a fever.  You pass large clots or tissue. Save any tissue for your health care provider to look at.  Your bleeding increases or you become lightheaded, feel weak, or have fainting episodes.   This information is not intended to replace advice given to you by your health care provider. Make sure you discuss any questions you have with your health care provider.   Document Released: 06/10/2006 Document  Revised: 03/16/2014 Document Reviewed: 09/22/2012 Elsevier Interactive Patient Education [employer].

## 2015-08-12 NOTE — MAU Provider Note (Signed)
History     CSN: [id number]  Arrival date and time: 08/12/15 6440   First Provider Initiated Contact with Patient 08/12/15 [phone]      Chief Complaint  Patient presents with  . Abdominal Pain  . Diarrhea  . Possible Pregnancy   HPI   Erin Thompson is a 40 y.o. female G2P0010 @ [redacted]w[redacted]d Here with diarrhea and abdominal pain. She is concerned she may have travelers diarrhea, she reports fever initially, however none now.   [place] having diarrhea and stomach cramping when she returned. The symptoms started Tuesday. The symptoms worsened over the weekend. No one else in her house is sick. She contacted her GYN and PCP; she is leaving here and dropping off a stool sample kit to [facility] which was ordered by her PCP.   She reports watery diarrhea every 4 hours.   [facility] he is [facility] infertility Dr.   The pain she is having is described as middle-upper abdominal pain that comes and goes, the patient rates her pain 5/10.   OB History    Gravida Para Term Preterm AB TAB SAB Ectopic Multiple Living   2 0   1   0  0      Past Medical History  Diagnosis Date  . Medical history non-contributory     Past Surgical History  Procedure Laterality Date  . Hernia repair      AT BIRTH  . Breast surgery      Bi-lat Implants    Family History  Problem Relation Age of Onset  . Hypertension Mother   . Diabetes Mother   . Diabetes Maternal Grandmother   . Cancer Maternal Grandmother     Thyroid  . Diabetes Father   . Hypertension Father     Social History  Substance Use Topics  . Smoking status: Never Smoker   . Smokeless tobacco: Never Used  . Alcohol Use: No     Comment: Rare    Allergies: No Known Allergies  Prescriptions prior to admission  Medication Sig Dispense Refill Last Dose  . Prenatal Vit-Fe Fumarate-FA (MULTIVITAMIN-PRENATAL) 27-0.8 MG TABS tablet Take 1 tablet by mouth  daily at 12 noon.   08/11/2015 at Unknown time  . progesterone (PROMETRIUM) 200 MG capsule Take 200 mg by mouth daily.  3 08/11/2015 at Unknown time  . sulfamethoxazole-trimethoprim (BACTRIM DS,SEPTRA DS) 800-160 MG tablet Take 1 tablet by mouth 2 (two) times daily. (Patient not taking: Reported on 08/12/2015) 6 tablet 0 Not Taking at Unknown time   Results for orders placed or performed during the hospital encounter of 08/12/15 (from the past 48 hour(s))  CBC     Status: None   Collection Time: 08/12/15  7:32 AM  Result Value Ref Range   WBC 4.3 4.0 - 10.5 K/uL   RBC 4.34 3.87 - 5.11 MIL/uL   Hemoglobin 13.3 12.0 - 15.0 g/dL   HCT 37.7 36.0 - 46.0 %   MCV 86.9 78.0 - 100.0 fL   MCH 30.6 26.0 - 34.0 pg   MCHC 35.3 30.0 - 36.0 g/dL   RDW 12.7 11.5 - 15.5 %   Platelets 191 150 - 400 K/uL  Comprehensive metabolic panel     Status: Abnormal   Collection Time: 08/12/15  7:32 AM  Result Value Ref Range   Sodium 133 (L) 135 - 145 mmol/L   Potassium 3.2 (L) 3.5 -  5.1 mmol/L   Chloride 103 101 - 111 mmol/L   CO2 23 22 - 32 mmol/L   Glucose, Bld 96 65 - 99 mg/dL   BUN 6 6 - 20 mg/dL   Creatinine, Ser 0.80 0.44 - 1.00 mg/dL   Calcium 8.5 (L) 8.9 - 10.3 mg/dL   Total Protein 7.1 6.5 - 8.1 g/dL   Albumin 4.1 3.5 - 5.0 g/dL   AST 31 15 - 41 U/L   ALT 30 14 - 54 U/L   Alkaline Phosphatase 59 38 - 126 U/L   Total Bilirubin 0.4 0.3 - 1.2 mg/dL   GFR calc non Af Amer >60 >60 mL/min   GFR calc Af Amer >60 >60 mL/min    Comment: (NOTE) The eGFR has been calculated using the CKD EPI equation. This calculation has not been validated in all clinical situations. eGFR's persistently <60 mL/min signify possible Chronic Kidney Disease.    Anion gap 7 5 - 15  Urinalysis, Routine w reflex microscopic (not at [facility])     Status: Abnormal   Collection Time: 08/12/15  7:40 AM  Result Value Ref Range   Color, Urine YELLOW YELLOW   APPearance CLEAR CLEAR   Specific Gravity, Urine <1.005 (L) 1.005 - 1.030   pH  6.0 5.0 - 8.0   Glucose, UA NEGATIVE NEGATIVE mg/dL   Hgb urine dipstick TRACE (A) NEGATIVE   Bilirubin Urine NEGATIVE NEGATIVE   Ketones, ur NEGATIVE NEGATIVE mg/dL   Protein, ur NEGATIVE NEGATIVE mg/dL   Nitrite NEGATIVE NEGATIVE   Leukocytes, UA NEGATIVE NEGATIVE  Urine microscopic-add on     Status: Abnormal   Collection Time: 08/12/15  7:40 AM  Result Value Ref Range   Squamous Epithelial / LPF 0-5 (A) NONE SEEN   WBC, UA 0-5 0 - 5 WBC/hpf   RBC / HPF 0-5 0 - 5 RBC/hpf   Bacteria, UA RARE (A) NONE SEEN  Pregnancy, urine POC     Status: Abnormal   Collection Time: 08/12/15  7:42 AM  Result Value Ref Range   Preg Test, Ur POSITIVE (A) NEGATIVE    Comment:        THE SENSITIVITY OF THIS METHODOLOGY IS >24 mIU/mL    Results for orders placed or performed during the hospital encounter of 08/12/15 (from the past 48 hour(s))  CBC     Status: None   Collection Time: 08/12/15  7:32 AM  Result Value Ref Range   WBC 4.3 4.0 - 10.5 K/uL   RBC 4.34 3.87 - 5.11 MIL/uL   Hemoglobin 13.3 12.0 - 15.0 g/dL   HCT 37.7 36.0 - 46.0 %   MCV 86.9 78.0 - 100.0 fL   MCH 30.6 26.0 - 34.0 pg   MCHC 35.3 30.0 - 36.0 g/dL   RDW 12.7 11.5 - 15.5 %   Platelets 191 150 - 400 K/uL  Comprehensive metabolic panel     Status: Abnormal   Collection Time: 08/12/15  7:32 AM  Result Value Ref Range   Sodium 133 (L) 135 - 145 mmol/L   Potassium 3.2 (L) 3.5 - 5.1 mmol/L   Chloride 103 101 - 111 mmol/L   CO2 23 22 - 32 mmol/L   Glucose, Bld 96 65 - 99 mg/dL   BUN 6 6 - 20 mg/dL   Creatinine, Ser 0.80 0.44 - 1.00 mg/dL   Calcium 8.5 (L) 8.9 - 10.3 mg/dL   Total Protein 7.1 6.5 - 8.1 g/dL   Albumin 4.1 3.5 - 5.0  g/dL   AST 31 15 - 41 U/L   ALT 30 14 - 54 U/L   Alkaline Phosphatase 59 38 - 126 U/L   Total Bilirubin 0.4 0.3 - 1.2 mg/dL   GFR calc non Af Amer >60 >60 mL/min   GFR calc Af Amer >60 >60 mL/min    Comment: (NOTE) The eGFR has been calculated using the CKD EPI equation. This calculation  has not been validated in all clinical situations. eGFR's persistently <60 mL/min signify possible Chronic Kidney Disease.    Anion gap 7 5 - 15  Urinalysis, Routine w reflex microscopic (not at [facility])     Status: Abnormal   Collection Time: 08/12/15  7:40 AM  Result Value Ref Range   Color, Urine YELLOW YELLOW   APPearance CLEAR CLEAR   Specific Gravity, Urine <1.005 (L) 1.005 - 1.030   pH 6.0 5.0 - 8.0   Glucose, UA NEGATIVE NEGATIVE mg/dL   Hgb urine dipstick TRACE (A) NEGATIVE   Bilirubin Urine NEGATIVE NEGATIVE   Ketones, ur NEGATIVE NEGATIVE mg/dL   Protein, ur NEGATIVE NEGATIVE mg/dL   Nitrite NEGATIVE NEGATIVE   Leukocytes, UA NEGATIVE NEGATIVE  Urine microscopic-add on     Status: Abnormal   Collection Time: 08/12/15  7:40 AM  Result Value Ref Range   Squamous Epithelial / LPF 0-5 (A) NONE SEEN   WBC, UA 0-5 0 - 5 WBC/hpf   RBC / HPF 0-5 0 - 5 RBC/hpf   Bacteria, UA RARE (A) NONE SEEN  Pregnancy, urine POC     Status: Abnormal   Collection Time: 08/12/15  7:42 AM  Result Value Ref Range   Preg Test, Ur POSITIVE (A) NEGATIVE    Comment:        THE SENSITIVITY OF THIS METHODOLOGY IS >24 mIU/mL    US Ob Comp Less 14 Wks  08/12/2015  CLINICAL DATA:  Recent travel. Now with abdominal pain and diarrhea. EXAM: OBSTETRIC <14 WK [place] AND TRANSVAGINAL OB US TECHNIQUE: Both transabdominal and transvaginal ultrasound examinations were performed for complete evaluation of the gestation as well as the maternal uterus, adnexal regions, and pelvic cul-de-sac. Transvaginal technique was performed to assess early pregnancy. COMPARISON:  None. FINDINGS: Intrauterine gestational sac: Single Yolk sac:  Yes Embryo:  No MSD: 9.6  mm   5 w   5  d Subchorionic hemorrhage:  Small Uterus/adnexae: Right ovary: Normal Left ovary: Normal Other :None Free fluid:  None IMPRESSION: 1. Probable early intrauterine gestational sac with yolk sac but no fetal pole, or cardiac activity yet visualized. Recommend  follow-up quantitative B-HCG levels and follow-up US in 14 days to confirm and assess viability. This recommendation follows SRU consensus guidelines: Diagnostic Criteria for Nonviable Pregnancy Early in the First Trimester. [place] Med 2013; 462:7035-00. 2. Small subchorionic hemorrhage Electronically Signed   By: Kerby Moors M.D.   On: 08/12/2015 10:20   US Ob Transvaginal  08/12/2015  CLINICAL DATA:  Recent travel. Now with abdominal pain and diarrhea. EXAM: OBSTETRIC <14 WK [place] AND TRANSVAGINAL OB US TECHNIQUE: Both transabdominal and transvaginal ultrasound examinations were performed for complete evaluation of the gestation as well as the maternal uterus, adnexal regions, and pelvic cul-de-sac. Transvaginal technique was performed to assess early pregnancy. COMPARISON:  None. FINDINGS: Intrauterine gestational sac: Single Yolk sac:  Yes Embryo:  No MSD: 9.6  mm   5 w   5  d Subchorionic hemorrhage:  Small Uterus/adnexae: Right ovary: Normal Left ovary: Normal Other :None  Free fluid:  None IMPRESSION: 1. Probable early intrauterine gestational sac with yolk sac but no fetal pole, or cardiac activity yet visualized. Recommend follow-up quantitative B-HCG levels and follow-up US in 14 days to confirm and assess viability. This recommendation follows SRU consensus guidelines: Diagnostic Criteria for Nonviable Pregnancy Early in the First Trimester. [place] [employer] Med 2013; 162:4469-50. 2. Small subchorionic hemorrhage Electronically Signed   By: Kerby Moors M.D.   On: 08/12/2015 10:20    Review of Systems  Constitutional: Negative for fever and chills.  Gastrointestinal: Positive for abdominal pain and diarrhea. Negative for nausea and vomiting.  Genitourinary: Negative for dysuria.   Physical Exam   Blood pressure 129/82, pulse 72, temperature 98.2 F (36.8 C), temperature source Oral, resp. rate 18, weight 145 lb (65.772 kg), last menstrual period 07/05/2015.  Physical Exam  Constitutional: She  is oriented to person, place, and time. She appears well-developed and well-nourished. No distress.  HENT:  Head: Normocephalic.  GI: Soft. Normal appearance. There is generalized tenderness. There is no rigidity, no rebound, no guarding and no CVA tenderness.  Musculoskeletal: Normal range of motion.  Neurological: She is alert and oriented to person, place, and time.  Skin: Skin is warm. She is not diaphoretic.  Psychiatric: Her behavior is normal.    MAU Course  Procedures  None  MDM  Azithromycin 1 gram given PO Pepcid 40 mg PO tabs Kdur 40 meq PO   Discussed patient with Dr. Toney Rakes.   Assessment and Plan   A:  1. Traveler's diarrhea   2. Abdominal pain in pregnancy, antepartum   3. Hypokalemia   4. Subchorionic hematoma in first trimester     P:  Discharge home in stable condition RX: Kdur x 5 days        Zantac Patient to follow up with OB dr as scheduled Return to MAU if symptoms worsen Clear liquid diet- then advance to BRAT diet Discussed subchorionic hemorrhage; return to MAU with worsening symptoms.   Lezlie Lye, NP 08/12/2015  11:19 AM

## 2015-08-12 NOTE — MAU Note (Addendum)
Recent trip to [place], returned on 5/29.  Started cramping and having diarrhea on Tues.  Went to UC on 6/3, was given supplies for a stool specimen, to be dropped off today, a rx that she can't take.  Not eating, is not throwing up- but within a few minutes of eating has diarrhea.  Has been seeing a fertility specialist, +preg on 5/30.  Fever of 100.5 on Friday.

## 2015-08-30 ENCOUNTER — Other Ambulatory Visit: Payer: Self-pay | Admitting: Obstetrics & Gynecology

## 2015-09-25 LAB — OB RESULTS CONSOLE HIV ANTIBODY (ROUTINE TESTING): HIV: NONREACTIVE

## 2015-09-25 LAB — OB RESULTS CONSOLE ANTIBODY SCREEN: ANTIBODY SCREEN: NEGATIVE

## 2015-09-25 LAB — OB RESULTS CONSOLE GC/CHLAMYDIA
CHLAMYDIA, DNA PROBE: NEGATIVE
GC PROBE AMP, GENITAL: NEGATIVE

## 2015-09-25 LAB — OB RESULTS CONSOLE RPR: RPR: NONREACTIVE

## 2015-09-25 LAB — OB RESULTS CONSOLE ABO/RH: RH Type: POSITIVE

## 2015-09-25 LAB — OB RESULTS CONSOLE HEPATITIS B SURFACE ANTIGEN: Hepatitis B Surface Ag: NEGATIVE

## 2015-09-25 LAB — OB RESULTS CONSOLE RUBELLA ANTIBODY, IGM: RUBELLA: IMMUNE

## 2016-02-21 ENCOUNTER — Encounter (HOSPITAL_BASED_OUTPATIENT_CLINIC_OR_DEPARTMENT_OTHER): Payer: Self-pay | Admitting: *Deleted

## 2016-02-21 ENCOUNTER — Emergency Department (HOSPITAL_BASED_OUTPATIENT_CLINIC_OR_DEPARTMENT_OTHER)
Admission: EM | Admit: 2016-02-21 | Discharge: 2016-02-21 | Disposition: A | Discharge status: Home / Self Care | Payer: Managed Care, Other (non HMO) | Attending: Emergency Medicine | Admitting: Emergency Medicine

## 2016-02-21 DIAGNOSIS — K645 Perianal venous thrombosis: Secondary | ICD-10-CM | POA: Insufficient documentation

## 2016-02-21 DIAGNOSIS — K6289 Other specified diseases of anus and rectum: Secondary | ICD-10-CM | POA: Diagnosis present

## 2016-02-21 MED ORDER — LIDOCAINE-EPINEPHRINE 1 %-1:100000 IJ SOLN: 10.0000 mL | Freq: Once | INTRAMUSCULAR | Status: DC

## 2016-02-21 MED ORDER — LIDOCAINE-EPINEPHRINE (PF) 2 %-1:200000 IJ SOLN
INTRAMUSCULAR | Status: AC
  Administered 2016-02-21: 21:00:00
  Filled 2016-02-21: qty 20

## 2016-02-21 NOTE — ED Provider Notes (Signed)
[facility] Provider Note   CSN: [id number] Arrival date & time: 02/21/16  2006  By signing my name below, I, Hansel Feinstein, attest that this documentation has been prepared under the direction and in the presence of Virgel Manifold, MD. Electronically Signed: Hansel Feinstein, ED Scribe. 02/21/16. 8:58 PM.   History   Chief Complaint Chief Complaint  Patient presents with  . Hemorrhoids     HPI Erin Thompson is a 40 y.o. female G2P0010 [redacted]w[redacted]d who presents to the Emergency Department complaining of severe rectal pain that began 2 days. Patient reports that current pain is similar to prior hemorrhoids, but more severe. She also reports hx of banding procedures. She has tried epsom salt bath with no relief and lidocaine cream with temporary relief. Patient states that she contacted her GI doctor who told her they wanted to wait until she delivered to perform any procedure on the new hemorrhoid. She denies bleeding, abdominal pain. She reports good fetal movement.   The history is provided by the patient and the spouse. No language interpreter was used.    Past Medical History:  Diagnosis Date  . Medical history non-contributory     Patient Active Problem List   Diagnosis Date Noted  . Anxiety and depression 12/28/2011  . Renal abscess, right 12/28/2011  . DUB (dysfunctional uterine bleeding) 12/28/2011    Past Surgical History:  Procedure Laterality Date  . BREAST SURGERY     Bi-lat Implants  . HERNIA REPAIR     AT BIRTH    OB History    Gravida Para Term Preterm AB Living   2 0     1 0   SAB TAB Ectopic Multiple Live Births       0           Home Medications    Prior to Admission medications   Medication Sig Start Date End Date Taking? Authorizing Provider  Prenatal Vit-Fe Fumarate-FA (MULTIVITAMIN-PRENATAL) 27-0.8 MG TABS tablet Take 1 tablet by mouth daily at 12 noon.    Historical Provider, MD    Family History Family History  Problem Relation  Age of Onset  . Hypertension Mother   . Diabetes Mother   . Diabetes Maternal Grandmother   . Cancer Maternal Grandmother     Thyroid  . Diabetes Father   . Hypertension Father     Social History Social History  Substance Use Topics  . Smoking status: Never Smoker  . Smokeless tobacco: Never Used  . Alcohol use No     Comment: Rare     Allergies   Patient has no known allergies.   Review of Systems Review of Systems  Gastrointestinal: Positive for rectal pain. Negative for abdominal pain and anal bleeding.  All other systems reviewed and are negative.    Physical Exam Updated Vital Signs BP 130/87 (BP Location: Right Arm)   Pulse 72   Temp 97.7 F (36.5 C) (Oral)   Resp 18   Ht 5\' 9"  (1.753 m)   Wt 170 lb (77.1 kg)   LMP 07/05/2015   SpO2 100%   BMI 25.10 kg/m   Physical Exam  Constitutional: She is oriented to person, place, and time. She appears well-developed and well-nourished. No distress.  HENT:  Head: Normocephalic and atraumatic.  Eyes: EOM are normal.  Neck: Normal range of motion.  Cardiovascular: Normal rate, regular rhythm and normal heart sounds.   Pulmonary/Chest: Effort normal and breath sounds normal.  Abdominal: Soft. She exhibits  no distension. There is no tenderness.  Gravid uterus.  Genitourinary:  Genitourinary Comments: External thrombosed hemorrhoid. Chaperone present throughout entire exam.    Musculoskeletal: Normal range of motion.  Neurological: She is alert and oriented to person, place, and time.  Skin: Skin is warm and dry.  Psychiatric: She has a normal mood and affect. Judgment normal.  Nursing note and vitals reviewed.    ED Treatments / Results   DIAGNOSTIC STUDIES: Oxygen Saturation is 98% on RA, normal by my interpretation.    COORDINATION OF CARE: 8:38 PM Discussed treatment plan with pt at bedside and pt agreed to plan.     Labs (all labs ordered are listed, but only abnormal results are  displayed) Labs Reviewed - No data to display  EKG  EKG Interpretation None       Radiology No results found.  Procedures Procedures (including critical care time)   Excision of Thrombosed External Hemorrhoid  Indication: Acutely thrombosed external hemorrhoid  Consent: Verbal consent was obtained. Risks and benefits were detailed. Included were not limited to pain, bleeding, infection, damage to anal sphincter muscles, need for further procedures, etc.  Alternatives were discussed including nonsurgical management with various medications and sitz baths. Advised that thrombosed hemorrhoids typically resolve by themselves although this may take several days.  Positioning: Patient was laid in lateral decubitus position. Nurse assisted by retracting her buttocks.  Preparation: The area was prepped in the usual fashion with Betadine.  Anesthetic: 4 mL of 2% lidocaine with epinephrine was injected around the patient hemorrhoid and into the hemorrhoid itself.  Procedure: An elliptical incision was made in the roof of the hemorrhoid with a 15 blade. Great care was taken to avoid anal sphincter muscles. Incision was directed radially from the anal orifice. Multiple clots were evacuated. Minimal bleeding otherwise. A folded 4 x 4 was placed after the procedure.  She tolerated the procedure well with no apparent complications.   Medications Ordered in ED Medications - No data to display   Initial Impression / Assessment and Plan / ED Course  I have reviewed the triage vital signs and the nursing notes.  Pertinent labs & imaging results that were available during my care of the patient were reviewed by me and considered in my medical decision making (see chart for details).  Clinical Course     39 year old female with acutely thrombosed external hemorrhoids. Clot was evacuated. Continued care was discussed. She can pursue formal hemorrhoidectomy after she has delivered.  Final  Clinical Impressions(s) / ED Diagnoses   Final diagnoses:  Thrombosed external hemorrhoids    New Prescriptions New Prescriptions   No medications on file   I personally preformed the services scribed in my presence. The recorded information has been reviewed is accurate. Virgel Manifold, MD.     Virgel Manifold, MD 02/26/16 [id number]

## 2016-02-21 NOTE — ED Triage Notes (Signed)
Pt 8 months pregnant.  EDD Feb 04/2016.  Reports hx of hemorrhoids that had to be banded <1 year ago.  Feels like she needs it done again.  Denies rectal bleeding.  Denies contractions or vaginal discharge or pressure.  [facility] advised that she come to the ED

## 2016-03-07 ENCOUNTER — Inpatient Hospital Stay (HOSPITAL_COMMUNITY)
Admission: AD | Admit: 2016-03-07 | Payer: Managed Care, Other (non HMO) | Source: Ambulatory Visit | Admitting: Obstetrics & Gynecology

## 2016-03-09 NOTE — L&D Delivery Note (Signed)
Patient was C/C/+2 and pushed for 55 minutes with epidural.    NSVD  female infant, Apgars 8,9, weight P.   The patient had a midline perineal second degree laceration repaired with 2-0 vicryl R. Fundus was firm. EBL was expected amount. Placenta was delivered intact. Vagina was clear.  Baby was vigorous and doing skin to skin with mother.  Sherria Riemann A

## 2016-03-11 ENCOUNTER — Other Ambulatory Visit: Payer: Self-pay | Admitting: Obstetrics and Gynecology

## 2016-03-11 LAB — OB RESULTS CONSOLE GBS: GBS: NEGATIVE

## 2016-03-18 ENCOUNTER — Ambulatory Visit (INDEPENDENT_AMBULATORY_CARE_PROVIDER_SITE_OTHER): Payer: Managed Care, Other (non HMO) | Admitting: Internal Medicine

## 2016-03-18 ENCOUNTER — Encounter: Payer: Self-pay | Admitting: Internal Medicine

## 2016-03-18 VITALS — BP 134/86 | HR 69 | Temp 98.9°F | Resp 16 | Ht 69.0 in | Wt 175.0 lb

## 2016-03-18 DIAGNOSIS — Z0000 Encounter for general adult medical examination without abnormal findings: Secondary | ICD-10-CM | POA: Diagnosis not present

## 2016-03-18 NOTE — Patient Instructions (Signed)
All other Health Maintenance issues reviewed.   All recommended immunizations and age-appropriate screenings are up-to-date or discussed.  No immunizations administered today.   Medications reviewed and updated.  No changes recommended at this time.   Please followup as needed and once a year   Health Maintenance, Female Introduction Adopting a healthy lifestyle and getting preventive care can go a long way to promote health and wellness. Talk with your health care provider about what schedule of regular examinations is right for you. This is a good chance for you to check in with your provider about disease prevention and staying healthy. In between checkups, there are plenty of things you can do on your own. Experts have done a lot of research about which lifestyle changes and preventive measures are most likely to keep you healthy. Ask your health care provider for more information. Weight and diet Eat a healthy diet  Be sure to include plenty of vegetables, fruits, low-fat dairy products, and lean protein.  Do not eat a lot of foods high in solid fats, added sugars, or salt.  Get regular exercise. This is one of the most important things you can do for your health.  Most adults should exercise for at least 150 minutes each week. The exercise should increase your heart rate and make you sweat (moderate-intensity exercise).  Most adults should also do strengthening exercises at least twice a week. This is in addition to the moderate-intensity exercise. Maintain a healthy weight  Body mass index (BMI) is a measurement that can be used to identify possible weight problems. It estimates body fat based on height and weight. Your health care provider can help determine your BMI and help you achieve or maintain a healthy weight.  For females 57 years of age and older:  A BMI below 18.5 is considered underweight.  A BMI of 18.5 to 24.9 is normal.  A BMI of 25 to 29.9 is considered  overweight.  A BMI of 30 and above is considered obese. Watch levels of cholesterol and blood lipids  You should start having your blood tested for lipids and cholesterol at 39 years of age, then have this test every 5 years.  You may need to have your cholesterol levels checked more often if:  Your lipid or cholesterol levels are high.  You are older than 39 years of age.  You are at high risk for heart disease. Cancer screening Lung Cancer  Lung cancer screening is recommended for adults 49-11 years old who are at high risk for lung cancer because of a history of smoking.  A yearly low-dose CT scan of the lungs is recommended for people who:  Currently smoke.  Have quit within the past 15 years.  Have at least a 30-pack-year history of smoking. A pack year is smoking an average of one pack of cigarettes a day for 1 year.  Yearly screening should continue until it has been 15 years since you quit.  Yearly screening should stop if you develop a health problem that would prevent you from having lung cancer treatment. Breast Cancer  Practice breast self-awareness. This means understanding how your breasts normally appear and feel.  It also means doing regular breast self-exams. Let your health care provider know about any changes, no matter how small.  If you are in your 20s or 30s, you should have a clinical breast exam (CBE) by a health care provider every 1-3 years as part of a regular health exam.  If you are 40 or older, have a CBE every year. Also consider having a breast X-ray (mammogram) every year.  If you have a family history of breast cancer, talk to your health care provider about genetic screening.  If you are at high risk for breast cancer, talk to your health care provider about having an MRI and a mammogram every year.  Breast cancer gene (BRCA) assessment is recommended for women who have family members with BRCA-related cancers. BRCA-related cancers  include:  Breast.  Ovarian.  Tubal.  Peritoneal cancers.  Results of the assessment will determine the need for genetic counseling and BRCA1 and BRCA2 testing. Cervical Cancer  Your health care provider may recommend that you be screened regularly for cancer of the pelvic organs (ovaries, uterus, and vagina). This screening involves a pelvic examination, including checking for microscopic changes to the surface of your cervix (Pap test). You may be encouraged to have this screening done every 3 years, beginning at age 20.  For women ages 9-65, health care providers may recommend pelvic exams and Pap testing every 3 years, or they may recommend the Pap and pelvic exam, combined with testing for human papilloma virus (HPV), every 5 years. Some types of HPV increase your risk of cervical cancer. Testing for HPV may also be done on women of any age with unclear Pap test results.  Other health care providers may not recommend any screening for nonpregnant women who are considered low risk for pelvic cancer and who do not have symptoms. Ask your health care provider if a screening pelvic exam is right for you.  If you have had past treatment for cervical cancer or a condition that could lead to cancer, you need Pap tests and screening for cancer for at least 20 years after your treatment. If Pap tests have been discontinued, your risk factors (such as having a new sexual partner) need to be reassessed to determine if screening should resume. Some women have medical problems that increase the chance of getting cervical cancer. In these cases, your health care provider may recommend more frequent screening and Pap tests. Colorectal Cancer  This type of cancer can be detected and often prevented.  Routine colorectal cancer screening usually begins at 39 years of age and continues through 39 years of age.  Your health care provider may recommend screening at an earlier age if you have risk factors  for colon cancer.  Your health care provider may also recommend using home test kits to check for hidden blood in the stool.  A small camera at the end of a tube can be used to examine your colon directly (sigmoidoscopy or colonoscopy). This is done to check for the earliest forms of colorectal cancer.  Routine screening usually begins at age 88.  Direct examination of the colon should be repeated every 5-10 years through 39 years of age. However, you may need to be screened more often if early forms of precancerous polyps or small growths are found. Skin Cancer  Check your skin from head to toe regularly.  Tell your health care provider about any new moles or changes in moles, especially if there is a change in a mole's shape or color.  Also tell your health care provider if you have a mole that is larger than the size of a pencil eraser.  Always use sunscreen. Apply sunscreen liberally and repeatedly throughout the day.  Protect yourself by wearing long sleeves, pants, a wide-brimmed hat, and sunglasses whenever  you are outside. Heart disease, diabetes, and high blood pressure  High blood pressure causes heart disease and increases the risk of stroke. High blood pressure is more likely to develop in:  People who have blood pressure in the high end of the normal range (130-139/85-89 mm Hg).  People who are overweight or obese.  People who are African American.  If you are 56-57 years of age, have your blood pressure checked every 3-5 years. If you are 70 years of age or older, have your blood pressure checked every year. You should have your blood pressure measured twice-once when you are at a hospital or clinic, and once when you are not at a hospital or clinic. Record the average of the two measurements. To check your blood pressure when you are not at a hospital or clinic, you can use:  An automated blood pressure machine at a pharmacy.  A home blood pressure monitor.  If you  are between 70 years and 27 years old, ask your health care provider if you should take aspirin to prevent strokes.  Have regular diabetes screenings. This involves taking a blood sample to check your fasting blood sugar level.  If you are at a normal weight and have a low risk for diabetes, have this test once every three years after 39 years of age.  If you are overweight and have a high risk for diabetes, consider being tested at a younger age or more often. Preventing infection Hepatitis B  If you have a higher risk for hepatitis B, you should be screened for this virus. You are considered at high risk for hepatitis B if:  You were born in a country where hepatitis B is common. Ask your health care provider which countries are considered high risk.  Your parents were born in a high-risk country, and you have not been immunized against hepatitis B (hepatitis B vaccine).  You have HIV or AIDS.  You use needles to inject street drugs.  You live with someone who has hepatitis B.  You have had sex with someone who has hepatitis B.  You get hemodialysis treatment.  You take certain medicines for conditions, including cancer, organ transplantation, and autoimmune conditions. Hepatitis C  Blood testing is recommended for:  Everyone born from [birth year] through 1963.  Anyone with known risk factors for hepatitis C. Sexually transmitted infections (STIs)  You should be screened for sexually transmitted infections (STIs) including gonorrhea and chlamydia if:  You are sexually active and are younger than 39 years of age.  You are older than 39 years of age and your health care provider tells you that you are at risk for this type of infection.  Your sexual activity has changed since you were last screened and you are at an increased risk for chlamydia or gonorrhea. Ask your health care provider if you are at risk.  If you do not have HIV, but are at risk, it may be recommended that you  take a prescription medicine daily to prevent HIV infection. This is called pre-exposure prophylaxis (PrEP). You are considered at risk if:  You are sexually active and do not regularly use condoms or know the HIV status of your partner(s).  You take drugs by injection.  You are sexually active with a partner who has HIV. Talk with your health care provider about whether you are at high risk of being infected with HIV. If you choose to begin PrEP, you should first be tested for HIV.  You should then be tested every 3 months for as long as you are taking PrEP. Pregnancy  If you are premenopausal and you may become pregnant, ask your health care provider about preconception counseling.  If you may become pregnant, take 400 to 800 micrograms (mcg) of folic acid every day.  If you want to prevent pregnancy, talk to your health care provider about birth control (contraception). Osteoporosis and menopause  Osteoporosis is a disease in which the bones lose minerals and strength with aging. This can result in serious bone fractures. Your risk for osteoporosis can be identified using a bone density scan.  If you are 62 years of age or older, or if you are at risk for osteoporosis and fractures, ask your health care provider if you should be screened.  Ask your health care provider whether you should take a calcium or vitamin D supplement to lower your risk for osteoporosis.  Menopause may have certain physical symptoms and risks.  Hormone replacement therapy may reduce some of these symptoms and risks. Talk to your health care provider about whether hormone replacement therapy is right for you. Follow these instructions at home:  Schedule regular health, dental, and eye exams.  Stay current with your immunizations.  Do not use any tobacco products including cigarettes, chewing tobacco, or electronic cigarettes.  If you are pregnant, do not drink alcohol.  If you are breastfeeding, limit  how much and how often you drink alcohol.  Limit alcohol intake to no more than 1 drink per day for nonpregnant women. One drink equals 12 ounces of beer, 5 ounces of wine, or 1 ounces of hard liquor.  Do not use street drugs.  Do not share needles.  Ask your health care provider for help if you need support or information about quitting drugs.  Tell your health care provider if you often feel depressed.  Tell your health care provider if you have ever been abused or do not feel safe at home. This information is not intended to replace advice given to you by your health care provider. Make sure you discuss any questions you have with your health care provider. Document Released: 09/08/2010 Document Revised: 08/01/2015 Document Reviewed: 11/27/2014  2017 Elsevier

## 2016-03-18 NOTE — Progress Notes (Signed)
Pre visit review using our clinic review tool, if applicable. No additional management support is needed unless otherwise documented below in the visit note. 

## 2016-03-18 NOTE — Progress Notes (Signed)
Subjective:    Patient ID: Erin Thompson, female    DOB: 1976-01-06, 41 y.o.   MRN: [id number]  HPI She is here to establish with a new pcp.  She is here for a physical exam.   She is pregnant - 3 weeks to go.  This is her first pregnancy.    She has a history of depression.  She has not been on medication for about two years.  She is concerned about the possibility of post partum depressed.  She feels good now.  She has no other concerns.     Medications and allergies reviewed with patient and updated if appropriate.  Patient Active Problem List   Diagnosis Date Noted  . Anxiety and depression 12/28/2011  . Renal abscess, right 12/28/2011  . DUB (dysfunctional uterine bleeding) 12/28/2011    Current Outpatient Prescriptions on File Prior to Visit  Medication Sig Dispense Refill  . Prenatal Vit-Fe Fumarate-FA (MULTIVITAMIN-PRENATAL) 27-0.8 MG TABS tablet Take 1 tablet by mouth daily at 12 noon.     No current facility-administered medications on file prior to visit.     Past Medical History:  Diagnosis Date  . Medical history non-contributory     Past Surgical History:  Procedure Laterality Date  . BREAST SURGERY     Bi-lat Implants  . HERNIA REPAIR     AT BIRTH    Social History   Social History  . Marital status: Married    Spouse name: N/A  . Number of children: N/A  . Years of education: N/A   Social History Main Topics  . Smoking status: Never Smoker  . Smokeless tobacco: Never Used  . Alcohol use No     Comment: Rare  . Drug use: No  . Sexual activity: Yes    Partners: Male    Birth control/ protection: , None   Other Topics Concern  . Not on file   Social History Narrative  . No narrative on file    Family History  Problem Relation Age of Onset  . Hypertension Mother   . Diabetes Mother   . Diabetes Maternal Grandmother   . Cancer Maternal Grandmother     Thyroid  . Diabetes Father   . Hypertension Father     Review of  Systems  Constitutional: Negative for chills and fever.  Eyes: Negative for visual disturbance.  Respiratory: Negative for cough, shortness of breath and wheezing.   Cardiovascular: Negative for chest pain, palpitations and leg swelling.  Gastrointestinal: Positive for diarrhea. Negative for abdominal pain, anal bleeding, constipation and nausea.       No gerd  Genitourinary: Negative for dysuria and hematuria.  Musculoskeletal: Negative for arthralgias and back pain.  Skin: Negative for rash.  Neurological: Negative for light-headedness and headaches.  Psychiatric/Behavioral: Negative for dysphoric mood. The patient is not nervous/anxious.        Objective:   Vitals:   03/18/16 0917  BP: 134/86  Pulse: 69  Resp: 16  Temp: 98.9 F (37.2 C)   Filed Weights   03/18/16 0917  Weight: 175 lb (79.4 kg)   Body mass index is 25.84 kg/m.  Wt Readings from Last 3 Encounters:  03/18/16 175 lb (79.4 kg)  02/21/16 170 lb (77.1 kg)  08/12/15 145 lb (65.8 kg)     Physical Exam Constitutional: She appears well-developed and well-nourished. No distress.  HENT:  Head: Normocephalic and atraumatic.  Right Ear: External ear normal. Normal ear canal and TM Left  Ear: External ear normal.  Normal ear canal and TM Mouth/Throat: Oropharynx is clear and moist.  Eyes: Conjunctivae and EOM are normal.  Neck: Neck supple. No tracheal deviation present. No thyromegaly present.  No carotid bruit  Cardiovascular: Normal rate, regular rhythm and normal heart sounds.   No murmur heard.  No edema. Pulmonary/Chest: Effort normal and breath sounds normal. No respiratory distress. She has no wheezes. She has no rales.  Breast: deferred to Gyn Abdominal: Pregnant Lymphadenopathy: She has no cervical adenopathy.  Skin: Skin is warm and dry. She is not diaphoretic.  Psychiatric: She has a normal mood and affect. Her behavior is normal.         Assessment & Plan:   Physical exam: Screening  blood work deferred Immunizations  Up to date  - deferred flu Gyn  Up to date  Exercise - not currently, will restart after pregnancy Weight  - pregnant Skin  - no concerns Substance abuse  none  See Problem List for Assessment and Plan of chronic medical problems.  FU annually, sooner if needed

## 2016-03-24 ENCOUNTER — Encounter: Payer: Self-pay | Admitting: Geriatric Medicine

## 2016-03-24 NOTE — Progress Notes (Signed)
Resulted by [facility], abstracted and sent to scan.

## 2016-03-26 ENCOUNTER — Other Ambulatory Visit: Payer: Self-pay | Admitting: Obstetrics and Gynecology

## 2016-03-31 ENCOUNTER — Encounter (HOSPITAL_COMMUNITY): Payer: Self-pay | Admitting: *Deleted

## 2016-03-31 ENCOUNTER — Telehealth (HOSPITAL_COMMUNITY): Payer: Self-pay | Admitting: *Deleted

## 2016-03-31 NOTE — Telephone Encounter (Signed)
Preadmission screen  

## 2016-04-06 ENCOUNTER — Other Ambulatory Visit: Payer: Self-pay | Admitting: Obstetrics and Gynecology

## 2016-04-07 ENCOUNTER — Encounter (HOSPITAL_COMMUNITY): Payer: Self-pay

## 2016-04-07 ENCOUNTER — Inpatient Hospital Stay (HOSPITAL_COMMUNITY): Payer: Managed Care, Other (non HMO) | Admitting: Anesthesiology

## 2016-04-07 ENCOUNTER — Inpatient Hospital Stay (HOSPITAL_COMMUNITY)
Admission: RE | Admit: 2016-04-07 | Discharge: 2016-04-09 | DRG: 775 | Disposition: A | Discharge status: Home / Self Care | Payer: Managed Care, Other (non HMO) | Source: Ambulatory Visit | Attending: Obstetrics and Gynecology | Admitting: Obstetrics and Gynecology

## 2016-04-07 DIAGNOSIS — Z3A39 39 weeks gestation of pregnancy: Secondary | ICD-10-CM | POA: Diagnosis not present

## 2016-04-07 DIAGNOSIS — Z3493 Encounter for supervision of normal pregnancy, unspecified, third trimester: Secondary | ICD-10-CM | POA: Diagnosis present

## 2016-04-07 LAB — CBC
HCT: 39.2 % (ref 36.0–46.0)
HEMOGLOBIN: 13.7 g/dL (ref 12.0–15.0)
MCH: 30.4 pg (ref 26.0–34.0)
MCHC: 34.9 g/dL (ref 30.0–36.0)
MCV: 86.9 fL (ref 78.0–100.0)
Platelets: 198 10*3/uL (ref 150–400)
RBC: 4.51 MIL/uL (ref 3.87–5.11)
RDW: 13.6 % (ref 11.5–15.5)
WBC: 8.7 10*3/uL (ref 4.0–10.5)

## 2016-04-07 LAB — TYPE AND SCREEN
ABO/RH(D): O POS
ANTIBODY SCREEN: NEGATIVE

## 2016-04-07 LAB — RPR: RPR: NONREACTIVE

## 2016-04-07 MED ORDER — ACETAMINOPHEN 325 MG PO TABS: 650.0000 mg | ORAL_TABLET | ORAL | Status: DC | PRN

## 2016-04-07 MED ORDER — BUTORPHANOL TARTRATE 1 MG/ML IJ SOLN: 1.0000 mg | INTRAMUSCULAR | Status: DC | PRN

## 2016-04-07 MED ORDER — DIBUCAINE 1 % RE OINT: 1.0000 "application " | TOPICAL_OINTMENT | RECTAL | Status: DC | PRN

## 2016-04-07 MED ORDER — LIDOCAINE HCL (PF) 1 % IJ SOLN
INTRAMUSCULAR | Status: DC | PRN
  Administered 2016-04-07 (×2): 4 mL via EPIDURAL

## 2016-04-07 MED ORDER — SODIUM CHLORIDE 0.9% FLUSH: 3.0000 mL | Freq: Two times a day (BID) | INTRAVENOUS | Status: DC

## 2016-04-07 MED ORDER — IBUPROFEN 800 MG PO TABS
800.0000 mg | ORAL_TABLET | Freq: Three times a day (TID) | ORAL | Status: DC
  Administered 2016-04-07 – 2016-04-09 (×5): 800 mg via ORAL
  Filled 2016-04-07 (×5): qty 1

## 2016-04-07 MED ORDER — ONDANSETRON HCL 4 MG/2ML IJ SOLN: 4.0000 mg | Freq: Four times a day (QID) | INTRAMUSCULAR | Status: DC | PRN

## 2016-04-07 MED ORDER — OXYCODONE-ACETAMINOPHEN 5-325 MG PO TABS: 1.0000 | ORAL_TABLET | ORAL | Status: DC | PRN

## 2016-04-07 MED ORDER — TETANUS-DIPHTH-ACELL PERTUSSIS 5-2.5-18.5 LF-MCG/0.5 IM SUSP: 0.5000 mL | Freq: Once | INTRAMUSCULAR | Status: DC

## 2016-04-07 MED ORDER — LIDOCAINE HCL (PF) 1 % IJ SOLN
30.0000 mL | INTRAMUSCULAR | Status: DC | PRN
  Filled 2016-04-07: qty 30

## 2016-04-07 MED ORDER — OXYTOCIN 40 UNITS IN LACTATED RINGERS INFUSION - SIMPLE MED
2.5000 [IU]/h | INTRAVENOUS | Status: DC
  Filled 2016-04-07: qty 1000

## 2016-04-07 MED ORDER — ZOLPIDEM TARTRATE 5 MG PO TABS: 5.0000 mg | ORAL_TABLET | Freq: Every evening | ORAL | Status: DC | PRN

## 2016-04-07 MED ORDER — PHENYLEPHRINE 40 MCG/ML (10ML) SYRINGE FOR IV PUSH (FOR BLOOD PRESSURE SUPPORT)
80.0000 ug | PREFILLED_SYRINGE | INTRAVENOUS | Status: DC | PRN
  Filled 2016-04-07: qty 10
  Filled 2016-04-07: qty 5

## 2016-04-07 MED ORDER — OXYTOCIN 40 UNITS IN LACTATED RINGERS INFUSION - SIMPLE MED
1.0000 m[IU]/min | INTRAVENOUS | Status: DC
  Administered 2016-04-07: 2 m[IU]/min via INTRAVENOUS

## 2016-04-07 MED ORDER — ACETAMINOPHEN 325 MG PO TABS
650.0000 mg | ORAL_TABLET | ORAL | Status: DC | PRN
  Administered 2016-04-08 (×2): 650 mg via ORAL
  Filled 2016-04-07 (×3): qty 2

## 2016-04-07 MED ORDER — FERROUS SULFATE 325 (65 FE) MG PO TABS
325.0000 mg | ORAL_TABLET | Freq: Two times a day (BID) | ORAL | Status: DC
  Administered 2016-04-08 – 2016-04-09 (×3): 325 mg via ORAL
  Filled 2016-04-07 (×3): qty 1

## 2016-04-07 MED ORDER — COCONUT OIL OIL: 1.0000 "application " | TOPICAL_OIL | Status: DC | PRN

## 2016-04-07 MED ORDER — MEASLES, MUMPS & RUBELLA VAC ~~LOC~~ INJ: 0.5000 mL | INJECTION | Freq: Once | SUBCUTANEOUS | Status: DC

## 2016-04-07 MED ORDER — OXYTOCIN BOLUS FROM INFUSION: 500.0000 mL | Freq: Once | INTRAVENOUS | Status: DC

## 2016-04-07 MED ORDER — DIPHENHYDRAMINE HCL 25 MG PO CAPS: 25.0000 mg | ORAL_CAPSULE | Freq: Four times a day (QID) | ORAL | Status: DC | PRN

## 2016-04-07 MED ORDER — SIMETHICONE 80 MG PO CHEW: 80.0000 mg | CHEWABLE_TABLET | ORAL | Status: DC | PRN

## 2016-04-07 MED ORDER — EPHEDRINE 5 MG/ML INJ
10.0000 mg | INTRAVENOUS | Status: DC | PRN
  Filled 2016-04-07: qty 4

## 2016-04-07 MED ORDER — PRENATAL MULTIVITAMIN CH
1.0000 | ORAL_TABLET | Freq: Every day | ORAL | Status: DC
  Filled 2016-04-07: qty 1

## 2016-04-07 MED ORDER — SOD CITRATE-CITRIC ACID 500-334 MG/5ML PO SOLN: 30.0000 mL | ORAL | Status: DC | PRN

## 2016-04-07 MED ORDER — LACTATED RINGERS IV SOLN: 500.0000 mL | INTRAVENOUS | Status: DC | PRN

## 2016-04-07 MED ORDER — BENZOCAINE-MENTHOL 20-0.5 % EX AERO
1.0000 "application " | INHALATION_SPRAY | CUTANEOUS | Status: DC | PRN
  Administered 2016-04-07: 1 via TOPICAL
  Filled 2016-04-07: qty 56

## 2016-04-07 MED ORDER — TERBUTALINE SULFATE 1 MG/ML IJ SOLN
0.2500 mg | Freq: Once | INTRAMUSCULAR | Status: DC | PRN
  Filled 2016-04-07: qty 1

## 2016-04-07 MED ORDER — OXYCODONE-ACETAMINOPHEN 5-325 MG PO TABS
2.0000 | ORAL_TABLET | ORAL | Status: DC | PRN
Start: 2016-04-07 — End: 2016-04-07

## 2016-04-07 MED ORDER — LACTATED RINGERS IV SOLN: 500.0000 mL | Freq: Once | INTRAVENOUS | Status: DC

## 2016-04-07 MED ORDER — ONDANSETRON HCL 4 MG PO TABS: 4.0000 mg | ORAL_TABLET | ORAL | Status: DC | PRN

## 2016-04-07 MED ORDER — MAGNESIUM HYDROXIDE 400 MG/5ML PO SUSP: 30.0000 mL | ORAL | Status: DC | PRN

## 2016-04-07 MED ORDER — SENNOSIDES-DOCUSATE SODIUM 8.6-50 MG PO TABS
2.0000 | ORAL_TABLET | ORAL | Status: DC
  Administered 2016-04-08 (×2): 2 via ORAL
  Filled 2016-04-07 (×2): qty 2

## 2016-04-07 MED ORDER — FENTANYL 2.5 MCG/ML BUPIVACAINE 1/10 % EPIDURAL INFUSION (WH - ANES)
14.0000 mL/h | INTRAMUSCULAR | Status: DC | PRN
  Administered 2016-04-07 (×2): 14 mL/h via EPIDURAL
  Filled 2016-04-07 (×2): qty 100

## 2016-04-07 MED ORDER — SODIUM CHLORIDE 0.9% FLUSH: 3.0000 mL | INTRAVENOUS | Status: DC | PRN

## 2016-04-07 MED ORDER — ONDANSETRON HCL 4 MG/2ML IJ SOLN: 4.0000 mg | INTRAMUSCULAR | Status: DC | PRN

## 2016-04-07 MED ORDER — LACTATED RINGERS IV SOLN
INTRAVENOUS | Status: DC
  Administered 2016-04-07 (×3): via INTRAVENOUS

## 2016-04-07 MED ORDER — METHYLERGONOVINE MALEATE 0.2 MG/ML IJ SOLN: 0.2000 mg | INTRAMUSCULAR | Status: DC | PRN

## 2016-04-07 MED ORDER — SODIUM CHLORIDE 0.9 % IV SOLN: 250.0000 mL | INTRAVENOUS | Status: DC | PRN

## 2016-04-07 MED ORDER — METHYLERGONOVINE MALEATE 0.2 MG PO TABS: 0.2000 mg | ORAL_TABLET | ORAL | Status: DC | PRN

## 2016-04-07 MED ORDER — OXYCODONE-ACETAMINOPHEN 5-325 MG PO TABS: 2.0000 | ORAL_TABLET | ORAL | Status: DC | PRN

## 2016-04-07 MED ORDER — WITCH HAZEL-GLYCERIN EX PADS: 1.0000 "application " | MEDICATED_PAD | CUTANEOUS | Status: DC | PRN

## 2016-04-07 MED ORDER — PHENYLEPHRINE 40 MCG/ML (10ML) SYRINGE FOR IV PUSH (FOR BLOOD PRESSURE SUPPORT)
80.0000 ug | PREFILLED_SYRINGE | INTRAVENOUS | Status: DC | PRN
  Filled 2016-04-07: qty 5

## 2016-04-07 MED ORDER — DIPHENHYDRAMINE HCL 50 MG/ML IJ SOLN: 12.5000 mg | INTRAMUSCULAR | Status: DC | PRN

## 2016-04-07 NOTE — Anesthesia Pain Management Evaluation Note (Signed)
  CRNA Pain Management Visit Note  Patient: Erin Thompson, 41 y.o., female  "Hello I am a member of the anesthesia team at [facility]. We have an anesthesia team available at all times to provide care throughout the hospital, including epidural management and anesthesia for C-section. I don't know your plan for the delivery whether it a natural birth, water birth, IV sedation, nitrous supplementation, doula or epidural, but we want to meet your pain goals."   1.Was your pain managed to your expectations on prior hospitalizations?   No   2.What is your expectation for pain management during this hospitalization?     Epidural  3.How can we help you reach that goal?   Record the patient's initial score and the patient's pain goal.   Pain: 2  Pain Goal: 6 [facility] wants you to be able to say your pain was always managed very well.  Jabier Mutton 04/07/2016

## 2016-04-07 NOTE — H&P (Signed)
41 y.o. [redacted]w[redacted]d  G2P0010 comes in for induction at term for AMA.  Otherwise has good fetal movement and no bleeding.  Past Medical History:  Diagnosis Date  . Depression   . Hemorrhoids   . History of chlamydia   . Medical history non-contributory     Past Surgical History:  Procedure Laterality Date  . BREAST SURGERY     Bi-lat Implants  . HEMORRHOID BANDING    . HERNIA REPAIR     AT BIRTH  . WRIST SURGERY      OB History  Gravida Para Term Preterm AB Living  2 0     1 0  SAB TAB Ectopic Multiple Live Births      0        # Outcome Date GA Lbr Len/2nd Weight Sex Delivery Anes PTL Lv  2 Current           1 AB 2016     SAB         Social History   Social History  . Marital status: Married    Spouse name: N/A  . Number of children: N/A  . Years of education: N/A   Occupational History  . Not on file.   Social History Main Topics  . Smoking status: Never Smoker  . Smokeless tobacco: Never Used  . Alcohol use No     Comment: Rare  . Drug use: No  . Sexual activity: Yes    Partners: Male    Birth control/ protection: , None   Other Topics Concern  . Not on file   Social History Narrative  . No narrative on file   Patient has no known allergies.    Prenatal Transfer Tool  Maternal Diabetes: No Genetic Screening: Normal Maternal Ultrasounds/Referrals: Normal Fetal Ultrasounds or other Referrals:  None Maternal Substance Abuse:  No Significant Maternal Medications:  None Significant Maternal Lab Results: None  Other PNC: uncomplicated.    Vitals:   04/07/16 0748  BP: 117/74  Pulse: 79  Resp: 18  Temp: 98.2 F (36.8 C)     Lungs/Cor:  NAD Abdomen:  soft, gravid Ex:  no cords, erythema SVE:  4/70/-2, AROM clear FHTs:  120, good STV, NST R Toco:  q 4-10   A/P   Induction for AMA.  GBS neg.  Orpheus Hayhurst A

## 2016-04-07 NOTE — Anesthesia Procedure Notes (Signed)
Epidural Patient location during procedure: OB Start time: 04/07/2016 10:10 AM  Staffing Anesthesiologist: Josephine Igo Performed: anesthesiologist   Preanesthetic Checklist Completed: patient identified, site marked, surgical consent, pre-op evaluation, timeout performed, IV checked, risks and benefits discussed and monitors and equipment checked  Epidural Patient position: sitting Prep: site prepped and draped and DuraPrep Patient monitoring: continuous pulse ox and blood pressure Approach: midline Location: L4-L5 Injection technique: LOR air  Needle:  Needle type: Tuohy  Needle gauge: 17 G Needle length: 9 cm and 9 Needle insertion depth: 5 cm cm Catheter type: closed end flexible Catheter size: 19 Gauge Catheter at skin depth: 10 cm Test dose: negative and Other  Assessment Events: blood not aspirated, injection not painful, no injection resistance, negative IV test and no paresthesia  Additional Notes Patient identified. Risks and benefits discussed including failed block, incomplete  Pain control, post dural puncture headache, nerve damage, paralysis, blood pressure Changes, nausea, vomiting, reactions to medications-both toxic and allergic and post Partum back pain. All questions were answered. Patient expressed understanding and wished to proceed. Sterile technique was used throughout procedure. Epidural site was Dressed with sterile barrier dressing. No paresthesias, signs of intravascular injection Or signs of intrathecal spread were encountered.  Patient was more comfortable after the epidural was dosed. Please see RN's note for documentation of vital signs and FHR which are stable.

## 2016-04-07 NOTE — Anesthesia Preprocedure Evaluation (Signed)
Anesthesia Evaluation  Patient identified by MRN, date of birth, ID band Patient awake    Reviewed: Allergy & Precautions, Patient's Chart, lab work & pertinent test results  Airway Mallampati: II       Dental no notable dental hx. (+) Teeth Intact   Pulmonary neg pulmonary ROS,    Pulmonary exam normal breath sounds clear to auscultation       Cardiovascular negative cardio ROS Normal cardiovascular exam Rhythm:Regular Rate:Normal     Neuro/Psych PSYCHIATRIC DISORDERS Anxiety Depression negative neurological ROS     GI/Hepatic negative GI ROS, Neg liver ROS,   Endo/Other  negative endocrine ROS  Renal/GU negative Renal ROS  negative genitourinary   Musculoskeletal negative musculoskeletal ROS (+)   Abdominal   Peds  Hematology negative hematology ROS (+)   Anesthesia Other Findings   Reproductive/Obstetrics (+) Pregnancy                             Anesthesia Physical Anesthesia Plan  ASA: II  Anesthesia Plan: Epidural   Post-op Pain Management:    Induction:   Airway Management Planned: Natural Airway  Additional Equipment:   Intra-op Plan:   Post-operative Plan:   Informed Consent: I have reviewed the patients History and Physical, chart, labs and discussed the procedure including the risks, benefits and alternatives for the proposed anesthesia with the patient or authorized representative who has indicated his/her understanding and acceptance.     Plan Discussed with: Anesthesiologist  Anesthesia Plan Comments:         Anesthesia Quick Evaluation

## 2016-04-07 NOTE — Progress Notes (Signed)
MOB was referred for history of depression/anxiety. * Referral screened out by Clinical Social Worker because none of the following criteria appear to apply: ~ History of anxiety/depression during this pregnancy, or of post-partum depression. ~ Diagnosis of anxiety and/or depression within last 3 years OR * MOB's symptoms currently being treated with medication and/or therapy. Please contact the Clinical Social Worker if needs arise, or if MOB requests.   

## 2016-04-08 ENCOUNTER — Telehealth: Payer: Self-pay | Admitting: Internal Medicine

## 2016-04-08 LAB — CBC
HEMATOCRIT: 31.9 % — AB (ref 36.0–46.0)
HEMOGLOBIN: 11.3 g/dL — AB (ref 12.0–15.0)
MCH: 31 pg (ref 26.0–34.0)
MCHC: 35.4 g/dL (ref 30.0–36.0)
MCV: 87.4 fL (ref 78.0–100.0)
Platelets: 169 10*3/uL (ref 150–400)
RBC: 3.65 MIL/uL — AB (ref 3.87–5.11)
RDW: 13.5 % (ref 11.5–15.5)
WBC: 12.8 10*3/uL — ABNORMAL HIGH (ref 4.0–10.5)

## 2016-04-08 NOTE — Anesthesia Postprocedure Evaluation (Signed)
Anesthesia Post Note  Patient: Erin Thompson  Procedure(s) Performed: * No procedures listed *  Patient location during evaluation: Mother Baby Anesthesia Type: Epidural Level of consciousness: awake and awake and alert Pain management: pain level controlled Vital Signs Assessment: post-procedure vital signs reviewed and stable Respiratory status: spontaneous breathing and nonlabored ventilation Cardiovascular status: stable Postop Assessment: no headache, no backache, epidural receding, patient able to bend at knees, no signs of nausea or vomiting and adequate PO intake Anesthetic complications: no        Last Vitals:  Vitals:   04/08/16 0023 04/08/16 0810  BP: 111/62 125/74  Pulse: 61 74  Resp: 20 18  Temp: 36.7 C 36.5 C    Last Pain:  Vitals:   04/08/16 0810  TempSrc: Axillary  PainSc:    Pain Goal:                 [employer]

## 2016-04-08 NOTE — Lactation Note (Signed)
This note was copied from a baby's chart. Lactation Consultation Note  Patient Name: Boy Veona Dory [id number] Date: 04/08/2016 Reason for consult: Initial assessment  Visited with new Mom for initial visit.  Baby 29 hrs old, born at term.  Mom states baby has been latching well.  Mom has breast implants, incisions under breast, not around areola.  Baby in football hold, dressed and swaddled in blanket.  Offered assist, unwrapped baby for STS.  Assisted with hand positioning, and importance of breast support throughout feeding.  Mom has long erect nipples, and explained to Mom importance of baby latching onto areola.  Baby having difficulty opening wide enough and some pinching noted on sides of nipples.  Latched baby with assistance. Baby sleepy needing stimulation.  No discomfort felt.  Demonstrated hand expression, colostrum easy to express.  Breastfeeding basics reviewed.  Encouraged STS and cue based feedings.   Brochure left in room.  Explained about IP and OP lactation services available to her.  Encouraged her to call for assistance with latch as needed.    Consult Status Consult Status: Follow-up Date: 05/07/16 Follow-up type: In-patient    Broadus John 04/08/2016, 3:56 PM

## 2016-04-08 NOTE — Telephone Encounter (Signed)
Rec'd from Mallory @ [employer] forward 6 pages to  Dr.Burns

## 2016-04-08 NOTE — Progress Notes (Deleted)
Patient is eating, ambulating, voiding.  Pain control is good.  Appropriate lochia, no complaints.  Vitals:   04/07/16 2055 04/07/16 2209 04/08/16 0023 04/08/16 0810  BP: 136/73 124/68 111/62 125/74  Pulse: 67 72 61 74  Resp: 18 18 20 18   Temp: 97.6 F (36.4 C) 97.4 F (36.3 C) 98 F (36.7 C) 97.7 F (36.5 C)  TempSrc: Oral Oral Oral Axillary  SpO2:  100%    Weight:      Height:        Fundus firm Perineum without swelling. No CT  Lab Results  Component Value Date   WBC 12.8 (H) 04/08/2016   HGB 11.3 (L) 04/08/2016   HCT 31.9 (L) 04/08/2016   MCV 87.4 04/08/2016   PLT 169 04/08/2016    --/--/O POS (01/30 0805)  A/P Post partum day 1.  Routine care.  Expect d/c 2/1.    Allyn Kenner

## 2016-04-08 NOTE — Progress Notes (Signed)
Patient is eating, ambulating, voiding.  Pain control is good.  Appropriate lochia, no complaints.  Vitals:   04/07/16 2055 04/07/16 2209 04/08/16 0023 04/08/16 0810  BP: 136/73 124/68 111/62 125/74  Pulse: 67 72 61 74  Resp: 18 18 20 18   Temp: 97.6 F (36.4 C) 97.4 F (36.3 C) 98 F (36.7 C) 97.7 F (36.5 C)  TempSrc: Oral Oral Oral Axillary  SpO2:  100%    Weight:      Height:        Fundus firm Perineum without swelling. No CT  Lab Results  Component Value Date   WBC 12.8 (H) 04/08/2016   HGB 11.3 (L) 04/08/2016   HCT 31.9 (L) 04/08/2016   MCV 87.4 04/08/2016   PLT 169 04/08/2016    --/--/O POS (01/30 0805)  A/P Post partum day 1. Circ desired, consent obtained.  Circ performed.  Routine care.  Expect d/c 2/1.    Allyn Kenner

## 2016-04-09 MED ORDER — IBUPROFEN 600 MG PO TABS: 600.0000 mg | ORAL_TABLET | Freq: Four times a day (QID) | ORAL | 0 refills | Status: DC | PRN

## 2016-04-09 MED ORDER — DOCUSATE SODIUM 100 MG PO CAPS: 100.0000 mg | ORAL_CAPSULE | Freq: Two times a day (BID) | ORAL | 0 refills | Status: DC

## 2016-04-09 MED ORDER — HYDROCORTISONE ACE-PRAMOXINE 1-1 % RE FOAM
1.0000 | Freq: Two times a day (BID) | RECTAL | Status: DC
  Administered 2016-04-09: 1 via RECTAL
  Filled 2016-04-09: qty 10

## 2016-04-09 MED ORDER — HYDROCODONE-ACETAMINOPHEN 5-325 MG PO TABS
ORAL_TABLET | ORAL | 0 refills | Status: DC
Start: 2016-04-09 — End: 2016-10-21

## 2016-04-09 NOTE — Lactation Note (Addendum)
This note was copied from a baby's chart. Lactation Consultation Note First time mom states BF going OK. Baby cluster fed. Mom tired, stating baby will not let her put him down.  Baby has purple bruise top of head. Discussed jaundice. Encouraged strict I&O. Answered a lot of questions mom had. Mom has implants, encouraged to post pump 5-6 times a day.  Mom has erect breast, evert nipples. Skin intact. Hand expression w/easy flow of colostrum. Discussed positioning, breast massage, props, and post-pumping w/storage. Hand pump given w/demonstration. Patient Name: Erin Thompson [id number] Date: 04/09/2016 Reason for consult: Follow-up assessment;Infant weight loss   Maternal Data    Feeding Feeding Type: Breast Fed Length of feed: 0 min  LATCH Score/Interventions Latch: Too sleepy or reluctant, no latch achieved, no sucking elicited. Intervention(s): Skin to skin;Teach feeding cues;Waking techniques Intervention(s): Assist with latch;Breast massage;Breast compression  Audible Swallowing: A few with stimulation Intervention(s): Skin to skin  Type of Nipple: Everted at rest and after stimulation  Comfort (Breast/Nipple): Soft / non-tender     Hold (Positioning): Assistance needed to correctly position infant at breast and maintain latch. Intervention(s): Skin to skin;Position options;Support Pillows;Breastfeeding basics reviewed  LATCH Score: 9  Lactation Tools Discussed/Used Pump Review: Setup, frequency, and cleaning;Milk Storage Initiated by:: Allayne Stack RN IBCLC Date initiated:: 04/09/16   Consult Status Consult Status: Follow-up Date: 04/09/16 Follow-up type: In-patient    Roland Lipke, Elta [place] 04/09/2016, 5:20 AM

## 2016-04-09 NOTE — Lactation Note (Signed)
This note was copied from a baby's chart. Lactation Consultation Note  Baby 62 hours old.  P1.  7.7% weight loss.  Was circumcised yesterday. Baby was latched on L breast upon entering after 30 min feeding. Baby came off.  Suggest burping him and bf on R side. Baby latched easily on R side and breastfed, sucks and swallows observed for an additional 10 min and fell asleep. Mother's nipples are evert and she can easily hand express. Suggest mother start post pumping after feedings 4 times a day for 10-15 min. and give volume back to baby to stabilize weight loss. Mother has DEBP at home.  Discussed engorgement and implants and how it is important to adjust as milk supply comes to volume, reduce pumping time if needed. Reviewed engorgement care and monitoring voids/stools. Set up OP appointment for 2/7 at [place] mother referral form to give to her Pediatrician.  Patient Name: Erin Thompson Today's Date: 04/09/2016 Reason for consult: Infant weight loss   Maternal Data    Feeding Feeding Type: Breast Fed Length of feed: 10 min  LATCH Score/Interventions Latch: Repeated attempts needed to sustain latch, nipple held in mouth throughout feeding, stimulation needed to elicit sucking reflex. Intervention(s): Skin to skin;Teach feeding cues;Waking techniques Intervention(s): Assist with latch;Breast massage;Breast compression  Audible Swallowing: A few with stimulation  Type of Nipple: Everted at rest and after stimulation  Comfort (Breast/Nipple): Soft / non-tender     Hold (Positioning): No assistance needed to correctly position infant at breast. Intervention(s): Skin to skin;Position options;Support Pillows;Breastfeeding basics reviewed  LATCH Score: 8  Lactation Tools Discussed/Used Pump Review: Setup, frequency, and cleaning;Milk Storage Initiated by:: Allayne Stack RN IBCLC Date initiated:: 04/09/16   Consult Status Consult Status: Follow-up Date:  04/09/16 Follow-up type: In-patient    Vivianne Master [facility] 04/09/2016, 9:08 AM

## 2016-04-09 NOTE — Discharge Summary (Signed)
Obstetric Discharge Summary Reason for Admission: induction of labor Prenatal Procedures: ultrasound Intrapartum Procedures: spontaneous vaginal delivery Postpartum Procedures: none Complications-Operative and Postpartum: 2nd degree perineal laceration Hemoglobin  Date Value Ref Range Status  04/08/2016 11.3 (L) 12.0 - 15.0 g/dL Final   HCT  Date Value Ref Range Status  04/08/2016 31.9 (L) 36.0 - 46.0 % Final    Physical Exam:  General: alert, cooperative and appears stated age 41: appropriate Uterine Fundus: firm Incision: healing well DVT Evaluation: No evidence of DVT seen on physical exam.  Discharge Diagnoses: Term Pregnancy-delivered  Discharge Information: Date: 04/09/2016 Activity: pelvic rest Diet: routine Medications: Ibuprofen, Colace and Percocet Condition: improved Instructions: refer to practice specific booklet Discharge to: home Follow-up Information    HORVATH,MICHELLE A, MD Follow up.   Specialty:  Obstetrics and Gynecology Contact information: [address] [place] [address] [phone]           Newborn Data: Live born female  Birth Weight: 7 lb 14.5 oz (3585 g) APGAR: 8, 9  Home with mother.  Erin Thompson. 04/09/2016, 8:48 AM

## 2016-04-15 ENCOUNTER — Ambulatory Visit (HOSPITAL_COMMUNITY): Admit: 2016-04-15 | Payer: Managed Care, Other (non HMO)

## 2016-04-15 ENCOUNTER — Ambulatory Visit: Payer: Self-pay

## 2016-04-15 NOTE — Lactation Note (Signed)
This note was copied from a baby's chart. Lactation Consult  Mother's reason for visit:  Visit Type: Feeding assessment Appointment Notes:  Breast implants Consult:  Initial Lactation Consultant:  Ave Filter  ________________________________________________________________________  44 Name:  Erin Thompson Date of Birth:  04/07/2014 Pediatrician:  [facility] Gender:  female Gestational Age: [redacted]w[redacted]d (At Birth) Birth Weight:  7 lb 14.5 oz (3585 g) Weight at Discharge:    7-4                      Date of Discharge:  04/09/16 There were no vitals filed for this visit.  Weight today:  7-12  ________________________________________________________________________  Mother's Name: Erin Thompson Type of delivery:  Vaginal, Spontaneous Delivery Breastfeeding Experience:  First baby    ________________________________________________________________________  Breastfeeding History (Post Discharge)  Frequency of breastfeeding: every 3 hours  Duration of feeding:  30 minutes one side    Pumping  Type of pump:  Medela pump in style Frequency:  After feeds Volume:  20-3ml saving milk  Infant Intake and Output Assessment  Voids:5-7   in 24 hrs.  Color:  Clear yellow Stools: 3-4  in 24 hrs.  Color:  Yellow  ________________________________________________________________________  Maternal Breast Assessment  Breast:  Full Nipple:  Erect Pain level:  0 Pain interventions:  Bra  _______________________________________________________________________ Feeding Assessment/Evaluation  Mom and 47 day old here for feeding assessment.  Mom has a history of breast implants.  Mom has an abundant milk supply and baby is gaining well.  Questions answered.  Encouraged to follow up with concerns prn.  Initial feeding assessment:  Infant's oral assessment:  WNL  Positioning:  Cross cradle Left breast  LATCH documentation:  Latch:  2 = Grasps breast easily,  tongue down, lips flanged, rhythmical sucking.  Audible swallowing:  2 = Spontaneous and intermittent  Type of nipple:  2 = Everted at rest and after stimulation  Comfort (Breast/Nipple):  2 = Soft / non-tender  Hold (Positioning):  2 = No assistance needed to correctly position infant at breast  LATCH score:  10  Attached assessment:  Deep  Lips flanged:  Yes.    Lips untucked:  No.  Suck assessment:  Nutritive     Pre-feed weight: 3514  g   Post-feed weight: 3570  g  Amount transferred: 56  ml Amount supplemented:  0 ml

## 2016-07-06 ENCOUNTER — Encounter: Payer: Self-pay | Admitting: Internal Medicine

## 2016-07-22 ENCOUNTER — Encounter: Payer: Self-pay | Admitting: Gynecology

## 2016-08-10 ENCOUNTER — Encounter: Payer: Self-pay | Admitting: *Deleted

## 2016-08-19 ENCOUNTER — Ambulatory Visit: Payer: Managed Care, Other (non HMO) | Admitting: Internal Medicine

## 2016-08-20 ENCOUNTER — Telehealth: Payer: Self-pay | Admitting: *Deleted

## 2016-08-20 NOTE — Telephone Encounter (Signed)
No show letter mailed to patient. 

## 2016-10-21 ENCOUNTER — Encounter: Payer: Self-pay | Admitting: Internal Medicine

## 2016-10-21 ENCOUNTER — Ambulatory Visit (INDEPENDENT_AMBULATORY_CARE_PROVIDER_SITE_OTHER): Payer: 59 | Admitting: Internal Medicine

## 2016-10-21 VITALS — BP 110/70 | HR 72 | Ht 69.0 in | Wt 148.0 lb

## 2016-10-21 DIAGNOSIS — K219 Gastro-esophageal reflux disease without esophagitis: Secondary | ICD-10-CM

## 2016-10-21 DIAGNOSIS — K602 Anal fissure, unspecified: Secondary | ICD-10-CM

## 2016-10-21 DIAGNOSIS — K641 Second degree hemorrhoids: Secondary | ICD-10-CM

## 2016-10-21 MED ORDER — DILTIAZEM GEL 2 %: CUTANEOUS | 0 refills | Status: DC

## 2016-10-21 NOTE — Progress Notes (Signed)
Subjective:    Patient ID: Erin Thompson, female    DOB: 1975-06-13, 41 y.o.   MRN: [id number]  HPI Erin Thompson is a 41 yo female with PMH Internal and external hemorrhoids who is seen in consultation at the request of Dr. Quay Burow to evaluate hemorrhoid and anorectal pain. She is here alone today. She reports she has a history of internal hemorrhoids and went through and completed hemorrhoidal banding series over a year ago with Dr. Earlean Shawl.  Prior to this banding she was having prolapse symptoms, bleeding and fecal smearing. All of these symptoms improved after banding. She then became pregnant in 2017 and her hemorrhoidal symptoms return. In December 2017 she developed acute and a rectal pain and was seen in the emergency department. She was diagnosed and treated for a thrombosed external hemorrhoid. Incision and drainage was performed. She then on to deliver a healthy baby boy who is now 57 months old.  Recently she has had return of her hemorrhoidal symptoms which she finds very bothersome. She reports that she has had minimal bleeding but is having frequent prolapse symptom with and after bowel movement. She also feels a perianal bulge and think she has a skin tag. She reports regular bowel movements without significant diarrhea or constipation. She has not seen melena. She is having some pain with passing stool which is sharp in nature. Today she is more concerned with the prolapsing symptoms than the pain with passing stool. She has a bowel movement most every day occasionally every other day. Stools are often soft.  Separate from this she describes some heartburn which she says is mild and fairly infrequent. This is been worse since delivering her son. It is not associated with nausea, vomiting, change in appetite, early satiety, dysphagia or odynophagia. She has occasionally used Alka-Seltzer for heartburn.  She denies a family history of colon or rectal cancer. No family history of  IBD.  Past Medical History:  Diagnosis Date  . Depression   . Hemorrhoids   . History of chlamydia    Current Meds  Medication Sig  . ibuprofen (ADVIL,MOTRIN) 600 MG tablet Take 1 tablet (600 mg total) by mouth every 6 (six) hours as needed.  . Prenatal Vit-Fe Fumarate-FA (MULTIVITAMIN-PRENATAL) 27-0.8 MG TABS tablet Take 1 tablet by mouth daily at 12 noon.   No Known Allergies  Family History  Problem Relation Age of Onset  . Hypertension Mother   . Diabetes Mother   . Diabetes Maternal Grandmother   . Cancer Maternal Grandmother        Thyroid  . Diabetes Father   . Hypertension Father   . Breast cancer Maternal Aunt   . Breast cancer Paternal Aunt    SH: She works in [employer]. She is married and has one 38-month-old son. She is never smoked tobacco. 0 average alcoholic drinks per day. One average caffeinated drink per day. No illicit drug use. No smokeless tobacco.  Review of Systems As per HPI, otherwise negative  Current Medications, Allergies, Past Medical History, Past Surgical History, Family History and Social History were reviewed in [employer] record.     Objective:   Physical Exam BP 110/70   Pulse 72   Ht 5\' 9"  (1.753 m)   Wt 148 lb (67.1 kg)   LMP 10/21/2016 (Exact Date)   Breastfeeding? No   BMI 21.86 kg/m  Constitutional: Well-developed and well-nourished. No distress. HEENT: Normocephalic and atraumatic. Oropharynx is clear and moist. Conjunctivae are  normal.  No scleral icterus. Neck: Neck supple. Trachea midline. Cardiovascular: Normal rate, regular rhythm and intact distal pulses. No M/R/G Pulmonary/chest: Effort normal and breath sounds normal. No wheezing, rales or rhonchi. Abdominal: Soft, nontender, nondistended. Bowel sounds active throughout. There are no masses palpable. No hepatosplenomegaly. Rectal: Small anterior external skin tag, posterior anal fissure seen which is mild. No rectal masses, minor posterior  anal pain with digital rectal exam. Anoscopy performed revealing small internal hemorrhoids most prominent in the left lateral and right anterior position. Extremities: no clubbing, cyanosis, or edema Neurological: Alert and oriented to person place and time. Skin: Skin is warm and dry. Psychiatric: Normal mood and affect. Behavior is normal.    Assessment & Plan:  41 yo female with [name] Internal and external hemorrhoids who is seen in consultation at the request of Dr. Quay Burow to evaluate hemorrhoid and anorectal pain.  1. Anal fissure -- posterior anal fissure visible today on external exam and anoscopy. Will treat with diltiazem gel 2% twice a day 3-4 weeks. I encouraged her to use this for 1 week after symptoms, particularly pain with passing stool, resolve.    2. Prolapsing internal hemorrhoids -- treated by banding sometime ago with success with recurrent symptoms after pregnancy. She does have hemorrhoids by exam today. We discussed treatment and she would like to proceed with hemorrhoidal banding.   PROCEDURE NOTE:  The patient presents with symptomatic grade 2 internal hemorrhoids, requesting rubber band ligation of her hemorrhoidal disease.  All risks, benefits and alternative forms of therapy were described and informed consent was obtained.   The anorectum was pre-medicated with 0.125% nitroglycerin ointment, RectiCare The decision was made to band the LL internal hemorrhoid, and the [place] was used to perform band ligation without complication.   Digital anorectal examination was then performed to assure proper positioning of the band, and to adjust the banded tissue as required.  The patient was discharged home without pain or other issues.  Dietary and behavioral recommendations were given and along with follow-up instructions.     The following adjunctive treatments were recommended: --Add Benefiber 1 tablespoon daily  The patient will return as scheduled for   follow-up and possible additional banding as required. No complications were encountered and the patient tolerated the procedure well.  3. GERD -- mild and infrequent heartburn. I recommend over-the-counter Zantac or Pepcid per box instruction twice daily as needed. If this becomes more frequent or worsens she is asked to notify me. She voices understanding.

## 2016-10-21 NOTE — Patient Instructions (Addendum)
HEMORRHOID BANDING PROCEDURE    FOLLOW-UP CARE   1. The procedure you have had should have been relatively painless since the banding of the area involved does not have nerve endings and there is no pain sensation.  The rubber band cuts off the blood supply to the hemorrhoid and the band may fall off as soon as 48 hours after the banding (the band may occasionally be seen in the toilet bowl following a bowel movement). You may notice a temporary feeling of fullness in the rectum which should respond adequately to plain Tylenol or Motrin.  2. Following the banding, avoid strenuous exercise that evening and resume full activity the next day.  A sitz bath (soaking in a warm tub) or bidet is soothing, and can be useful for cleansing the area after bowel movements.     3. To avoid constipation, take two tablespoons of natural wheat bran, natural oat bran, flax, Benefiber or any over the counter fiber supplement and increase your water intake to 7-8 glasses daily.    4. Unless you have been prescribed anorectal medication, do not put anything inside your rectum for two weeks: No suppositories, enemas, fingers, etc.  5. Occasionally, you may have more bleeding than usual after the banding procedure.  This is often from the untreated hemorrhoids rather than the treated one.  Don't be concerned if there is a tablespoon or so of blood.  If there is more blood than this, lie flat with your bottom higher than your head and apply an ice pack to the area. If the bleeding does not stop within a half an hour or if you feel faint, call our office at [phone] or go to the emergency room.  6. Problems are not common; however, if there is a substantial amount of bleeding, severe pain, chills, fever or difficulty passing urine (very rare) or other problems, you should call us at [phone] or report to the nearest emergency room.  7. Do not stay seated continuously for more than 2-3 hours for a day or two  after the procedure.  Tighten your buttock muscles 10-15 times every two hours and take 10-15 deep breaths every 1-2 hours.  Do not spend more than a few minutes on the toilet if you cannot empty your bowel; instead re-visit the toilet at a later time.   We have scheduled you for your 2nd banding appointment for 11-26-16 at 10:45am.  We have sent a prescription for diltiazem gel to [facility]. You should apply a pea size amount to your rectum two times a day for 3 to 4 weeks  [facility] information is below: Address: [address] [place],  [address]  [phone]  Please purchase the following medications over the counter and take as directed: Zantac or Pepcid - Take according to box instructions as needed.  If you are age 38 or older, your body mass index should be between 23-30. Your Body mass index is 21.86 kg/m. If this is out of the aforementioned range listed, please consider follow up with your Primary Care Provider.  If you are age 62 or younger, your body mass index should be between 19-25. Your Body mass index is 21.86 kg/m. If this is out of the aformentioned range listed, please consider follow up with your Primary Care Provider.

## 2016-11-26 ENCOUNTER — Encounter: Payer: 59 | Admitting: Internal Medicine

## 2016-12-16 ENCOUNTER — Encounter: Payer: Self-pay | Admitting: Internal Medicine

## 2016-12-16 ENCOUNTER — Ambulatory Visit (INDEPENDENT_AMBULATORY_CARE_PROVIDER_SITE_OTHER): Payer: 59 | Admitting: Internal Medicine

## 2016-12-16 ENCOUNTER — Telehealth: Payer: Self-pay | Admitting: Internal Medicine

## 2016-12-16 VITALS — BP 102/68 | HR 72 | Ht 69.0 in | Wt 147.4 lb

## 2016-12-16 DIAGNOSIS — R1013 Epigastric pain: Secondary | ICD-10-CM | POA: Diagnosis not present

## 2016-12-16 DIAGNOSIS — K602 Anal fissure, unspecified: Secondary | ICD-10-CM

## 2016-12-16 DIAGNOSIS — K625 Hemorrhage of anus and rectum: Secondary | ICD-10-CM

## 2016-12-16 DIAGNOSIS — K648 Other hemorrhoids: Secondary | ICD-10-CM

## 2016-12-16 MED ORDER — SUPREP BOWEL PREP KIT 17.5-3.13-1.6 GM/177ML PO SOLN: 1.0000 | ORAL | 0 refills | Status: DC

## 2016-12-16 NOTE — Patient Instructions (Signed)
You have been scheduled for an endoscopy and colonoscopy. Please follow the written instructions given to you at your visit today. Please pick up your prep supplies at the pharmacy within the next 1-3 days. If you use inhalers (even only as needed), please bring them with you on the day of your procedure. Your physician has requested that you go to www.startemmi.com and enter the access code given to you at your visit today. This web site gives a general overview about your procedure. However, you should still follow specific instructions given to you by our office regarding your preparation for the procedure.  HEMORRHOID BANDING PROCEDURE    FOLLOW-UP CARE   1. The procedure you have had should have been relatively painless since the banding of the area involved does not have nerve endings and there is no pain sensation.  The rubber band cuts off the blood supply to the hemorrhoid and the band may fall off as soon as 48 hours after the banding (the band may occasionally be seen in the toilet bowl following a bowel movement). You may notice a temporary feeling of fullness in the rectum which should respond adequately to plain Tylenol or Motrin.  2. Following the banding, avoid strenuous exercise that evening and resume full activity the next day.  A sitz bath (soaking in a warm tub) or bidet is soothing, and can be useful for cleansing the area after bowel movements.     3. To avoid constipation, take two tablespoons of natural wheat bran, natural oat bran, flax, Benefiber or any over the counter fiber supplement and increase your water intake to 7-8 glasses daily.    4. Unless you have been prescribed anorectal medication, do not put anything inside your rectum for two weeks: No suppositories, enemas, fingers, etc.  5. Occasionally, you may have more bleeding than usual after the banding procedure.  This is often from the untreated hemorrhoids rather than the treated one.  Don't be concerned  if there is a tablespoon or so of blood.  If there is more blood than this, lie flat with your bottom higher than your head and apply an ice pack to the area. If the bleeding does not stop within a half an hour or if you feel faint, call our office at [phone] or go to the emergency room.  6. Problems are not common; however, if there is a substantial amount of bleeding, severe pain, chills, fever or difficulty passing urine (very rare) or other problems, you should call us at [phone] or report to the nearest emergency room.  7. Do not stay seated continuously for more than 2-3 hours for a day or two after the procedure.  Tighten your buttock muscles 10-15 times every two hours and take 10-15 deep breaths every 1-2 hours.  Do not spend more than a few minutes on the toilet if you cannot empty your bowel; instead re-visit the toilet at a later time.

## 2016-12-17 NOTE — Progress Notes (Signed)
   Subjective:    Patient ID: Erin Thompson, female    DOB: [birth year], 41 y.o.   MRN: [id number]  HPI Erin Thompson is a 41 yo female returning for follow-up for internal hemorrhoids and anal fissure.  She was last seen on 10/21/2016 to evaluate hemorrhoids and anorectal pain. She was found to have an anal fissure and we performed hemorrhoidal banding #1 to the left lateral internal hemorrhoid.  She reports that she did well after hemorrhoidal banding. She admits to not using the diltiazem gel for very long and she still having some pain with passing stool. Intermittent bleeding with passing stool. She's noticed some epigastric discomfort. No nausea. Some constipation this week with harder stool. Not sure why his diet has not changed. She reports being nervous that something is wrong with her colon. She's had 2 friends with colon cancer. She requests colonoscopy.   Review of Systems As per HPI, otherwise negative  Current Medications, Allergies, Past Medical History, Past Surgical History, Family History and Social History were reviewed in [employer] record.     Objective:   Physical Exam BP 102/68   Pulse 72   Ht 5\' 9"  (1.753 m)   Wt 147 lb 6.4 oz (66.9 kg)   LMP 12/02/2016 (Exact Date)   BMI 21.77 kg/m  Gen: awake, alert, NAD HEENT: anicteric, op clear CV: RRR, no mrg Pulm: CTA b/l Abd: soft, NT/ND, +BS throughout Rectal: Persistent posterior anal fissure which is tender, no masses, hard small stool in rectal vault Ext: no c/c/e Neuro: nonfocal      Assessment & Plan:  41 yo female returning for follow-up for internal hemorrhoids and anal fissure.    1. Anal fissure -- inadequate treatment of fissure due to nonadherence and persistent anal fissure on exam. We discussed the correct way to use diltiazem gel which is twice daily for 3-6 weeks. I advised that she use this gel for 1 week longer after symptoms resolve. --Begin stool softener, Colace  100-200 mg at bedtime to avoid hard stool  2. Epigastric abdominal pain/patient concern for colon cancer/colonic pathology -- we discussed the risk benefits and alternatives to endoscopy and she is agreeable and wishes to proceed. We'll proceed with upper endoscopy and colonoscopy to evaluate epigastric abdominal pain, rectal bleeding and alternating bowel habits.  3. Internal hemorrhoids -- persistent and proceeding with additional banding today  PROCEDURE NOTE:  The patient presents with symptomatic grade 2 internal hemorrhoids, requesting rubber band ligation of her hemorrhoidal disease.  All risks, benefits and alternative forms of therapy were described and informed consent was obtained.   The anorectum was pre-medicated with 0.125% nitroglycerin ointment and RectiCare The decision was made to band the RA internal hemorrhoid (LL banded in Aug 2018), and the [place]ak[place] was used to perform band ligation without complication.   Digital anorectal examination was then performed to assure proper positioning of the band, and to adjust the banded tissue as required.  The patient was discharged home without pain or other issues.  Dietary and behavioral recommendations were given and along with follow-up instructions.     The patient will return as scheduled for  follow-up and possible additional banding as required. No complications were encountered and the patient tolerated the procedure well.

## 2016-12-17 NOTE — Telephone Encounter (Signed)
Pt scheduled for banding #3 02/19/17@3 :45pm. Pt aware of appt.

## 2016-12-21 ENCOUNTER — Encounter: Payer: Self-pay | Admitting: Internal Medicine

## 2017-01-04 ENCOUNTER — Encounter: Payer: Self-pay | Admitting: Internal Medicine

## 2017-01-04 ENCOUNTER — Ambulatory Visit (AMBULATORY_SURGERY_CENTER): Payer: 59 | Admitting: Internal Medicine

## 2017-01-04 VITALS — BP 127/86 | HR 62 | Temp 97.5°F | Resp 10 | Ht 69.0 in | Wt 148.0 lb

## 2017-01-04 DIAGNOSIS — K635 Polyp of colon: Secondary | ICD-10-CM

## 2017-01-04 DIAGNOSIS — D125 Benign neoplasm of sigmoid colon: Secondary | ICD-10-CM

## 2017-01-04 DIAGNOSIS — K602 Anal fissure, unspecified: Secondary | ICD-10-CM | POA: Diagnosis not present

## 2017-01-04 DIAGNOSIS — K620 Anal polyp: Secondary | ICD-10-CM | POA: Diagnosis not present

## 2017-01-04 DIAGNOSIS — R1013 Epigastric pain: Secondary | ICD-10-CM | POA: Diagnosis present

## 2017-01-04 DIAGNOSIS — K625 Hemorrhage of anus and rectum: Secondary | ICD-10-CM | POA: Diagnosis not present

## 2017-01-04 DIAGNOSIS — K2950 Unspecified chronic gastritis without bleeding: Secondary | ICD-10-CM | POA: Diagnosis not present

## 2017-01-04 MED ORDER — SODIUM CHLORIDE 0.9 % IV SOLN: 500.0000 mL | INTRAVENOUS | Status: DC

## 2017-01-04 NOTE — Patient Instructions (Signed)
**   Handout given on polyps **  YOU HAD AN ENDOSCOPIC PROCEDURE TODAY AT [facility]:   Refer to the procedure report that was given to you for any specific questions about what was found during the examination.  If the procedure report does not answer your questions, please call your gastroenterologist to clarify.  If you requested that your care partner not be given the details of your procedure findings, then the procedure report has been included in a sealed envelope for you to review at your convenience later.  YOU SHOULD EXPECT: Some feelings of bloating in the abdomen. Passage of more gas than usual.  Walking can help get rid of the air that was put into your GI tract during the procedure and reduce the bloating. If you had a lower endoscopy (such as a colonoscopy or flexible sigmoidoscopy) you may notice spotting of blood in your stool or on the toilet paper. If you underwent a bowel prep for your procedure, you may not have a normal bowel movement for a few days.  Please Note:  You might notice some irritation and congestion in your nose or some drainage.  This is from the oxygen used during your procedure.  There is no need for concern and it should clear up in a day or so.  SYMPTOMS TO REPORT IMMEDIATELY:   Following lower endoscopy (colonoscopy or flexible sigmoidoscopy):  Excessive amounts of blood in the stool  Significant tenderness or worsening of abdominal pains  Swelling of the abdomen that is new, acute  Fever of 100F or higher   Following upper endoscopy (EGD)  Vomiting of blood or coffee ground material  New chest pain or pain under the shoulder blades  Painful or persistently difficult swallowing  New shortness of breath  Fever of 100F or higher  Black, tarry-looking stools  For urgent or emergent issues, a gastroenterologist can be reached at any hour by calling [phone].   DIET:  We do recommend a small meal at first, but then you may  proceed to your regular diet.  Drink plenty of fluids but you should avoid alcoholic beverages for 24 hours.  ACTIVITY:  You should plan to take it easy for the rest of today and you should NOT DRIVE or use heavy machinery until tomorrow (because of the sedation medicines used during the test).    FOLLOW UP: Our staff will call the number listed on your records the next business day following your procedure to check on you and address any questions or concerns that you may have regarding the information given to you following your procedure. If we do not reach you, we will leave a message.  However, if you are feeling well and you are not experiencing any problems, there is no need to return our call.  We will assume that you have returned to your regular daily activities without incident.  If any biopsies were taken you will be contacted by phone or by letter within the next 1-3 weeks.  Please call us at [phone] if you have not heard about the biopsies in 3 weeks.    SIGNATURES/CONFIDENTIALITY: You and/or your care partner have signed paperwork which will be entered into your electronic medical record.  These signatures attest to the fact that that the information above on your After Visit Summary has been reviewed and is understood.  Full responsibility of the confidentiality of this discharge information lies with you and/or your care-partner.

## 2017-01-04 NOTE — Op Note (Signed)
[facility] Patient Name: Erin Thompson Procedure Date: 01/04/2017 3:32 PM MRN: [id number] Endoscopist: Jerene Bears , MD Age: 41 Referring MD:  Date of Birth: [birth year] Gender: Female Account #: [id number] Procedure:                Upper GI endoscopy Indications:              Epigastric abdominal pain Medicines:                Monitored Anesthesia Care Procedure:                Pre-Anesthesia Assessment:                           - Prior to the procedure, a History and Physical                            was performed, and patient medications and                            allergies were reviewed. The patient's tolerance of                            previous anesthesia was also reviewed. The risks                            and benefits of the procedure and the sedation                            options and risks were discussed with the patient.                            All questions were answered, and informed consent                            was obtained. Prior Anticoagulants: The patient has                            taken no previous anticoagulant or antiplatelet                            agents. ASA Grade Assessment: II - A patient with                            mild systemic disease. After reviewing the risks                            and benefits, the patient was deemed in                            satisfactory condition to undergo the procedure.                           After obtaining informed consent, the endoscope was  passed under direct vision. Throughout the                            procedure, the patient's blood pressure, pulse, and                            oxygen saturations were monitored continuously. The                            Endoscope was introduced through the mouth, and                            advanced to the second part of duodenum. The upper                            GI endoscopy was accomplished  without difficulty.                            The patient tolerated the procedure well. Scope In: Scope Out: Findings:                 The esophagus was normal.                           The stomach was normal. Biopsies were taken with a                            cold forceps for histology and Helicobacter pylori                            testing.                           The examined duodenum was normal. Complications:            No immediate complications. Estimated Blood Loss:     Estimated blood loss was minimal. Impression:               - Normal esophagus.                           - Normal stomach. Biopsied.                           - Normal examined duodenum. Recommendation:           - Patient has a contact number available for                            emergencies. The signs and symptoms of potential                            delayed complications were discussed with the                            patient. Return to normal activities tomorrow.  Written discharge instructions were provided to the                            patient.                           - Resume previous diet.                           - Continue present medications.                           - Await pathology results. Jerene Bears, MD 01/04/2017 4:33:52 PM This report has been signed electronically.

## 2017-01-04 NOTE — Progress Notes (Signed)
Called to room to assist during endoscopic procedure.  Patient ID and intended procedure confirmed with present staff. Received instructions for my participation in the procedure from the performing physician.  

## 2017-01-04 NOTE — Op Note (Signed)
[facility] Patient Name: Erin Thompson Procedure Date: 01/04/2017 3:32 PM MRN: [id number] Endoscopist: Jerene Bears , MD Age: 41 Referring MD:  Date of Birth: [birth year] Gender: Female Account #: [id number] Procedure:                Colonoscopy Indications:              Rectal bleeding Medicines:                Monitored Anesthesia Care Procedure:                Pre-Anesthesia Assessment:                           - Prior to the procedure, a History and Physical                            was performed, and patient medications and                            allergies were reviewed. The patient's tolerance of                            previous anesthesia was also reviewed. The risks                            and benefits of the procedure and the sedation                            options and risks were discussed with the patient.                            All questions were answered, and informed consent                            was obtained. Prior Anticoagulants: The patient has                            taken no previous anticoagulant or antiplatelet                            agents. ASA Grade Assessment: II - A patient with                            mild systemic disease. After reviewing the risks                            and benefits, the patient was deemed in                            satisfactory condition to undergo the procedure.                           After obtaining informed consent, the colonoscope  was passed under direct vision. Throughout the                            procedure, the patient's blood pressure, pulse, and                            oxygen saturations were monitored continuously. The                            Model PCF-[id number]) scope was introduced                            through the anus and advanced to the the terminal                            ileum. The colonoscopy was performed  without                            difficulty. The patient tolerated the procedure                            well. The quality of the bowel preparation was                            excellent. The terminal ileum, ileocecal valve,                            appendiceal orifice, and rectum were photographed. Scope In: 4:13:46 PM Scope Out: 4:28:33 PM Scope Withdrawal Time: 0 hours 11 minutes 30 seconds  Total Procedure Duration: 0 hours 14 minutes 47 seconds  Findings:                 The digital rectal exam was normal.                           The terminal ileum appeared normal.                           A 4 mm polyp was found in the sigmoid colon. The                            polyp was sessile. The polyp was removed with a                            cold snare. Resection and retrieval were complete.                           A 4 mm polyp was found in the anus. The polyp was                            sessile. This is likely a skin tag versus  hypertrophied anal papilla. This was biopsied with                            a cold forceps for histology to exclude AIN.                           A single (solitary) ulcer was found in the distal                            rectum. No bleeding was present. This is result of                            hemorrhoidal banding performed earlier this month.                           The exam was otherwise normal throughout the                            examined colon. Complications:            No immediate complications. Estimated Blood Loss:     Estimated blood loss was minimal. Impression:               - The examined portion of the ileum was normal.                           - One 4 mm polyp in the sigmoid colon, removed with                            a cold snare. Resected and retrieved.                           - One 4 mm polyp versus skin tag versus                            hypertrophied anal papilla at the anus.  Biopsied to                            exclude dysplasia.                           - A single (solitary) post-hemorrhoidal banding                            ulcer in the distal rectum. Recommendation:           - Patient has a contact number available for                            emergencies. The signs and symptoms of potential                            delayed complications were discussed with the  patient. Return to normal activities tomorrow.                            Written discharge instructions were provided to the                            patient.                           - Resume previous diet.                           - Continue present medications.                           - Await pathology results.                           - Repeat colonoscopy is recommended. The                            colonoscopy date will be determined after pathology                            results from today's exam become available for                            review. Jerene Bears, MD 01/04/2017 4:38:58 PM This report has been signed electronically.

## 2017-01-04 NOTE — Progress Notes (Signed)
Report given to PACU, vss 

## 2017-01-05 ENCOUNTER — Telehealth: Payer: Self-pay

## 2017-01-05 NOTE — Telephone Encounter (Signed)
  Follow up Call-  Call Erin Thompson number 01/04/2017  Post procedure Call Erin Thompson phone  # [phone] cell  Permission to leave phone message Yes  Some recent data might be hidden     Patient questions:  Do you have a fever, pain , or abdominal swelling? No. Pain Score  0 *  Have you tolerated food without any problems? Yes.    Have you been able to return to your normal activities? Yes.    Do you have any questions about your discharge instructions: Diet   No. Medications  No. Follow up visit  No.  Do you have questions or concerns about your Care? No.  Actions: * If pain score is 4 or above: No action needed, pain <4.

## 2017-01-07 ENCOUNTER — Encounter: Payer: Self-pay | Admitting: Internal Medicine

## 2017-01-14 ENCOUNTER — Encounter: Payer: Self-pay | Admitting: Internal Medicine

## 2017-01-14 DIAGNOSIS — K635 Polyp of colon: Secondary | ICD-10-CM | POA: Insufficient documentation

## 2017-01-20 IMAGING — US US OB TRANSVAGINAL
1 series · 15 of 28 positions shown · non-contrast
Comparison: None.

CLINICAL DATA: Recent travel. Now with abdominal pain and diarrhea.

EXAM:
OBSTETRIC <14 WK US AND TRANSVAGINAL OB US
TECHNIQUE: Both transabdominal and transvaginal ultrasound examinations were
performed for complete evaluation of the gestation as well as the
maternal uterus, adnexal regions, and pelvic cul-de-sac.
Transvaginal technique was performed to assess early pregnancy.

[Series 1: us ob transvaginal · 15 of 63 slices shown]
[im 1/63]
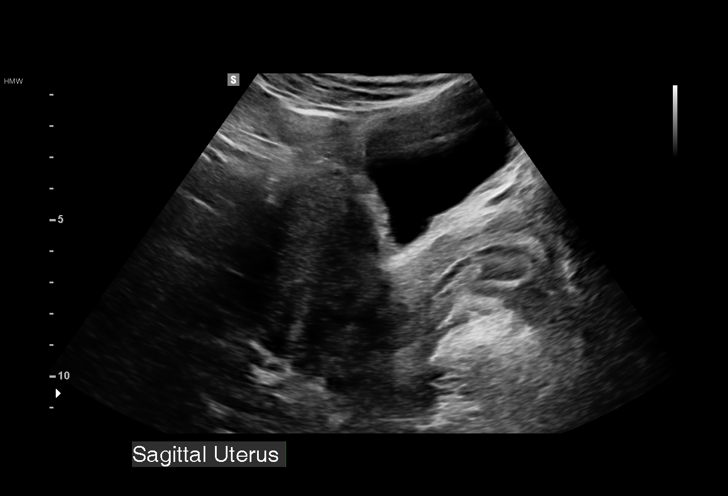
[im 5/63]
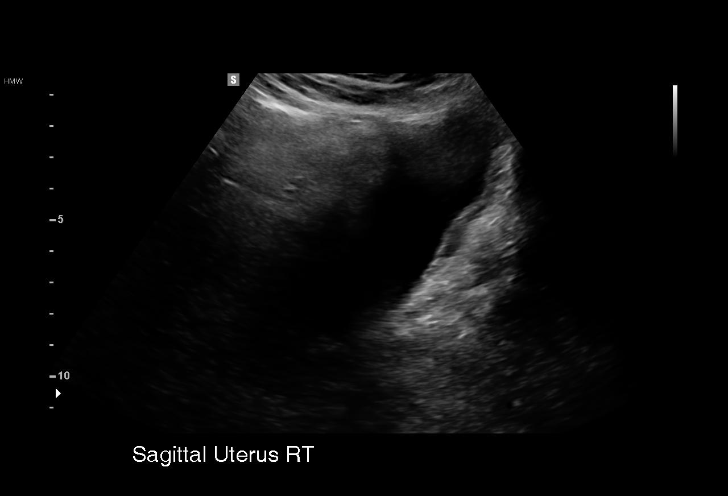
[im 10/63]
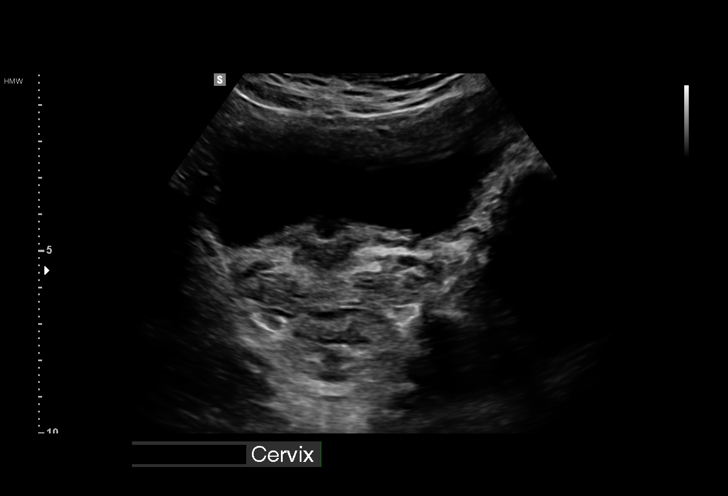
[im 14/63]
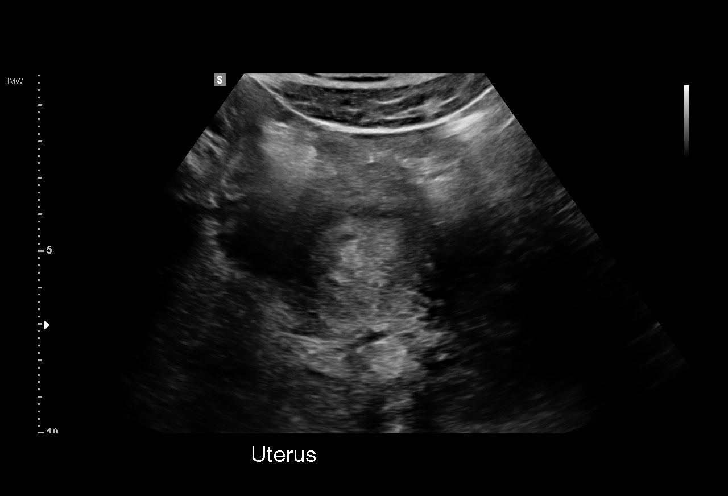
[im 19/63]
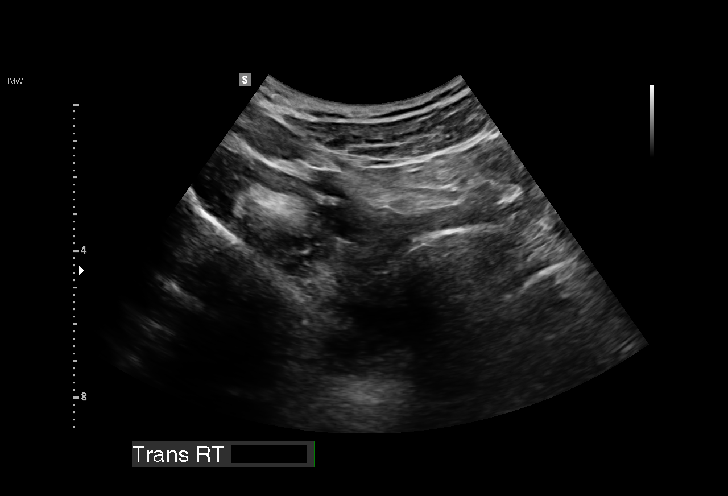
[im 23/63]
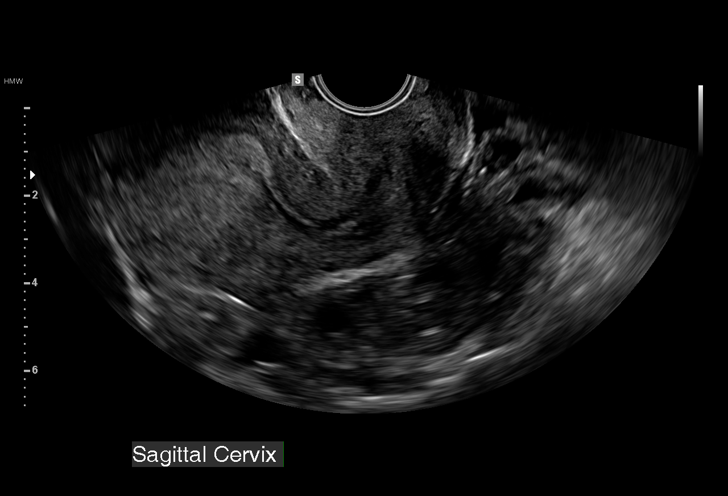
[im 28/63]
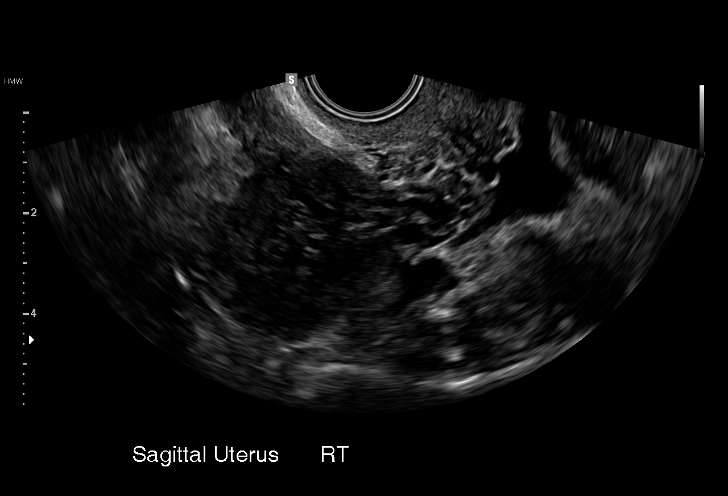
[im 33/63]
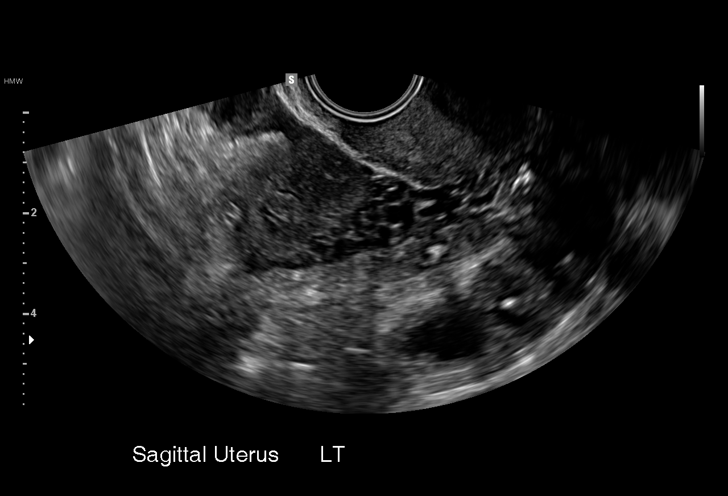
[im 35/63]
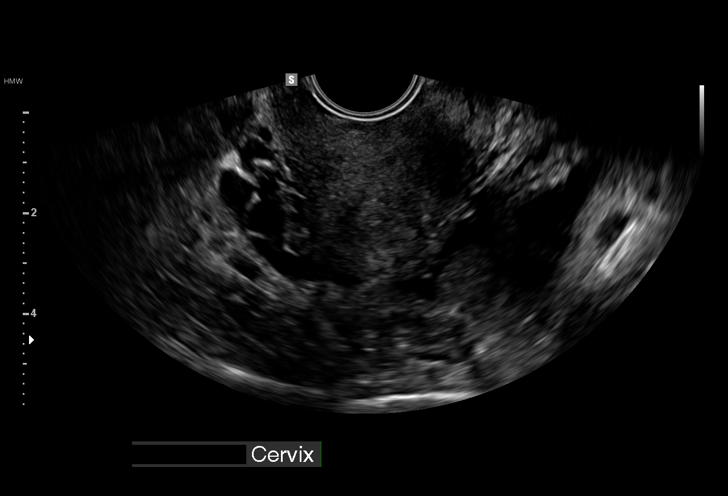
[im 40/63]
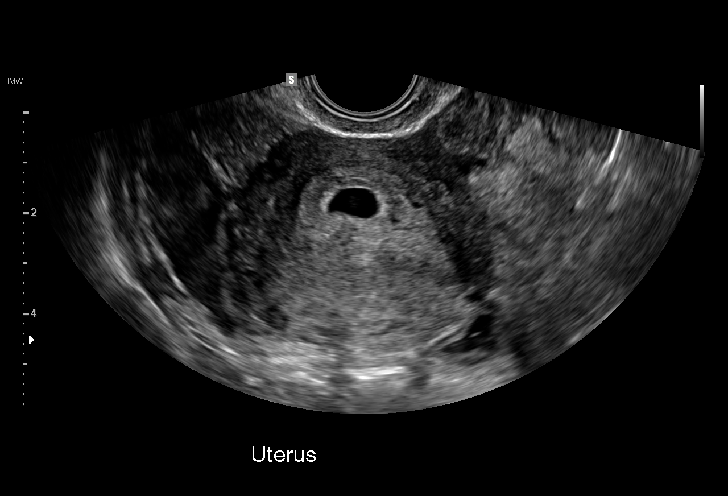
[im 44/63]
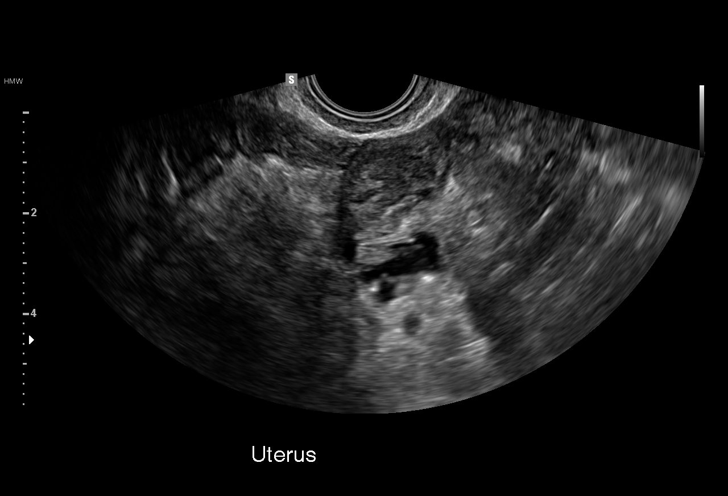
[im 49/63]
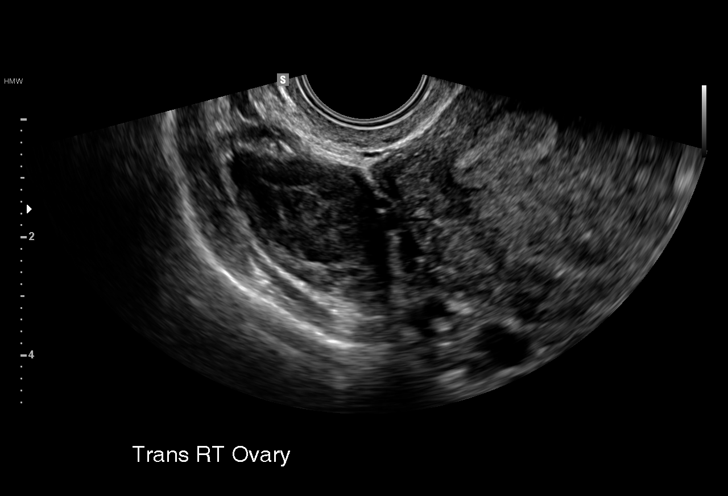
[im 53/63]
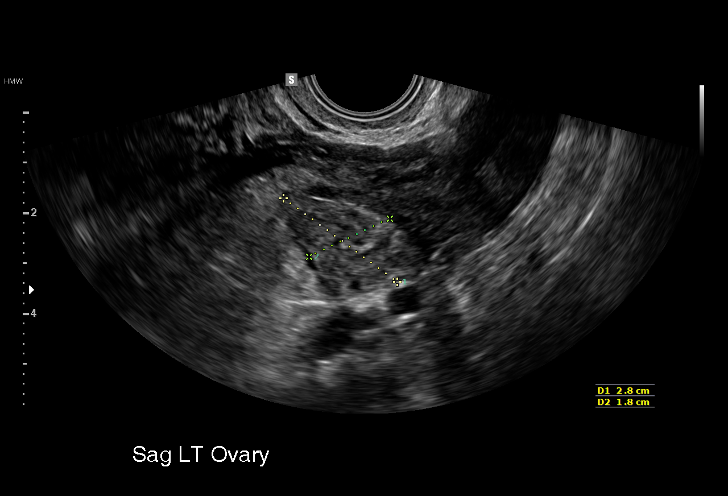
[im 58/63]
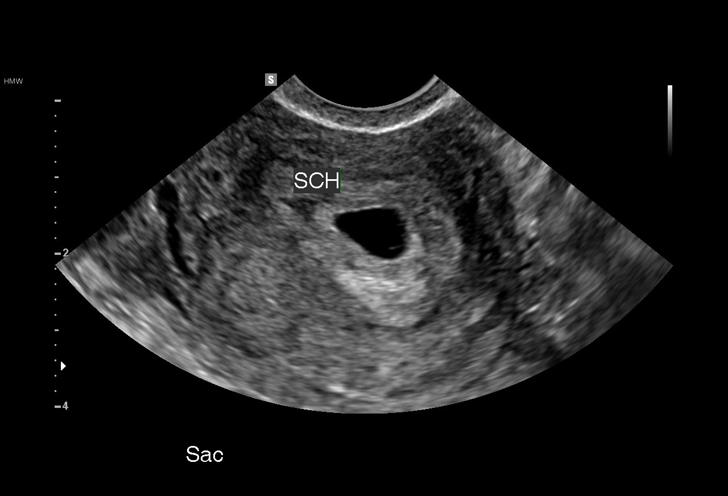
[im 63/63]
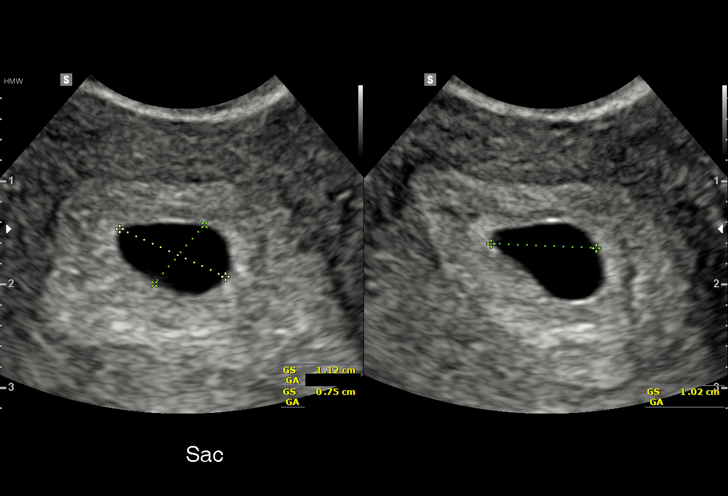

[15 of 28 positions shown; findings below may reference images not displayed]

FINDINGS: Intrauterine gestational sac: Single

Yolk sac:  Yes

Embryo:  No

MSD: 9.6  mm   5 w   5  d

Subchorionic hemorrhage:  Small

Uterus/adnexae:

Right ovary: Normal

Left ovary: Normal

Other :None

Free fluid:  None
IMPRESSION: 1. Probable early intrauterine gestational sac with yolk sac but no
fetal pole, or cardiac activity yet visualized. Recommend follow-up
quantitative B-HCG levels and follow-up US in 14 days to confirm and
assess viability. This recommendation follows SRU consensus
guidelines: Diagnostic Criteria for Nonviable Pregnancy Early in the
First Trimester. N Engl J Med 5444; [DATE].
2. Small subchorionic hemorrhage

## 2017-02-11 ENCOUNTER — Encounter: Payer: Self-pay | Admitting: *Deleted

## 2017-02-19 ENCOUNTER — Encounter: Payer: 59 | Admitting: Internal Medicine

## 2017-06-11 ENCOUNTER — Telehealth: Payer: Self-pay | Admitting: Internal Medicine

## 2017-06-11 NOTE — Telephone Encounter (Signed)
Pt scheduled for banding appt 08/03/17@3 :45pm. Pt aware and wanted to be placed on the cancellation list. Appt added to wait list.

## 2017-08-03 ENCOUNTER — Encounter: Payer: 59 | Admitting: Internal Medicine

## 2017-08-03 ENCOUNTER — Encounter

## 2017-11-12 LAB — OB RESULTS CONSOLE ABO/RH: RH Type: POSITIVE

## 2017-11-12 LAB — OB RESULTS CONSOLE RPR: RPR: NONREACTIVE

## 2017-11-12 LAB — OB RESULTS CONSOLE ANTIBODY SCREEN: Antibody Screen: NEGATIVE

## 2017-11-12 LAB — OB RESULTS CONSOLE GC/CHLAMYDIA
Chlamydia: NEGATIVE
Gonorrhea: NEGATIVE

## 2017-11-12 LAB — OB RESULTS CONSOLE HEPATITIS B SURFACE ANTIGEN: Hepatitis B Surface Ag: NEGATIVE

## 2017-11-12 LAB — OB RESULTS CONSOLE RUBELLA ANTIBODY, IGM: Rubella: IMMUNE

## 2017-11-12 LAB — OB RESULTS CONSOLE HIV ANTIBODY (ROUTINE TESTING): HIV: NONREACTIVE

## 2017-12-22 ENCOUNTER — Encounter: Payer: Self-pay | Admitting: Internal Medicine

## 2018-03-09 NOTE — L&D Delivery Note (Signed)
Delivery Note At 1:40 PM a viable and healthy female was delivered via Vaginal, Spontaneous (Presentation: Left Occiput; Anterior ).  APGAR: 8, 8; weight pending .   Placenta status: spontaneous, intact.  Cord: 3V with loose nuchal cord reduced on the perineum Anesthesia:  Epidural Episiotomy: None Lacerations: 2nd degree;Perineal Suture Repair: 3.0 vicryl Est. Blood Loss (mL): 100  Mom to postpartum.  Baby to Couplet care / Skin to Skin.  Vanessa Kick 06/01/2018, 2:03 PM

## 2018-05-11 LAB — OB RESULTS CONSOLE GBS: GBS: POSITIVE

## 2018-05-18 ENCOUNTER — Other Ambulatory Visit: Payer: Self-pay | Admitting: Obstetrics and Gynecology

## 2018-05-24 ENCOUNTER — Encounter (HOSPITAL_COMMUNITY): Payer: Self-pay | Admitting: *Deleted

## 2018-05-24 ENCOUNTER — Telehealth (HOSPITAL_COMMUNITY): Payer: Self-pay | Admitting: *Deleted

## 2018-06-01 ENCOUNTER — Inpatient Hospital Stay (HOSPITAL_COMMUNITY)
Admission: AD | Admit: 2018-06-01 | Discharge: 2018-06-02 | DRG: 807 | Disposition: A | Discharge status: Home / Self Care | Payer: 59 | Attending: Obstetrics and Gynecology | Admitting: Obstetrics and Gynecology

## 2018-06-01 ENCOUNTER — Inpatient Hospital Stay (HOSPITAL_COMMUNITY): Payer: 59 | Admitting: Anesthesiology

## 2018-06-01 ENCOUNTER — Inpatient Hospital Stay (HOSPITAL_COMMUNITY): Payer: 59

## 2018-06-01 ENCOUNTER — Other Ambulatory Visit: Payer: Self-pay

## 2018-06-01 ENCOUNTER — Encounter (HOSPITAL_COMMUNITY): Payer: Self-pay | Admitting: *Deleted

## 2018-06-01 DIAGNOSIS — O26893 Other specified pregnancy related conditions, third trimester: Secondary | ICD-10-CM | POA: Diagnosis present

## 2018-06-01 DIAGNOSIS — Z3A39 39 weeks gestation of pregnancy: Secondary | ICD-10-CM

## 2018-06-01 DIAGNOSIS — O09529 Supervision of elderly multigravida, unspecified trimester: Secondary | ICD-10-CM

## 2018-06-01 LAB — RPR: RPR Ser Ql: NONREACTIVE

## 2018-06-01 LAB — TYPE AND SCREEN
ABO/RH(D): O POS
Antibody Screen: NEGATIVE

## 2018-06-01 LAB — CBC
HEMATOCRIT: 40.8 % (ref 36.0–46.0)
Hemoglobin: 13.5 g/dL (ref 12.0–15.0)
MCH: 30.2 pg (ref 26.0–34.0)
MCHC: 33.1 g/dL (ref 30.0–36.0)
MCV: 91.3 fL (ref 80.0–100.0)
Platelets: 163 10*3/uL (ref 150–400)
RBC: 4.47 MIL/uL (ref 3.87–5.11)
RDW: 13.4 % (ref 11.5–15.5)
WBC: 9.8 10*3/uL (ref 4.0–10.5)
nRBC: 0 % (ref 0.0–0.2)

## 2018-06-01 MED ORDER — DIBUCAINE 1 % RE OINT: 1.0000 "application " | TOPICAL_OINTMENT | RECTAL | Status: DC | PRN

## 2018-06-01 MED ORDER — FLUCONAZOLE 150 MG PO TABS
150.0000 mg | ORAL_TABLET | Freq: Every day | ORAL | Status: DC
  Administered 2018-06-02: 150 mg via ORAL
  Filled 2018-06-01: qty 1

## 2018-06-01 MED ORDER — ACETAMINOPHEN 325 MG PO TABS: 650.0000 mg | ORAL_TABLET | ORAL | Status: DC | PRN

## 2018-06-01 MED ORDER — OXYCODONE-ACETAMINOPHEN 5-325 MG PO TABS: 2.0000 | ORAL_TABLET | ORAL | Status: DC | PRN

## 2018-06-01 MED ORDER — SODIUM CHLORIDE 0.9 % IV SOLN
5.0000 10*6.[IU] | Freq: Once | INTRAVENOUS | Status: AC
  Administered 2018-06-01: 5 10*6.[IU] via INTRAVENOUS
  Filled 2018-06-01: qty 5

## 2018-06-01 MED ORDER — PHENYLEPHRINE 40 MCG/ML (10ML) SYRINGE FOR IV PUSH (FOR BLOOD PRESSURE SUPPORT): 80.0000 ug | PREFILLED_SYRINGE | INTRAVENOUS | Status: DC | PRN

## 2018-06-01 MED ORDER — ZOLPIDEM TARTRATE 5 MG PO TABS: 5.0000 mg | ORAL_TABLET | Freq: Every evening | ORAL | Status: DC | PRN

## 2018-06-01 MED ORDER — ONDANSETRON HCL 4 MG/2ML IJ SOLN: 4.0000 mg | INTRAMUSCULAR | Status: DC | PRN

## 2018-06-01 MED ORDER — SOD CITRATE-CITRIC ACID 500-334 MG/5ML PO SOLN: 30.0000 mL | ORAL | Status: DC | PRN

## 2018-06-01 MED ORDER — OXYTOCIN BOLUS FROM INFUSION
500.0000 mL | Freq: Once | INTRAVENOUS | Status: AC
  Administered 2018-06-01: 500 mL via INTRAVENOUS

## 2018-06-01 MED ORDER — EPHEDRINE 5 MG/ML INJ: 10.0000 mg | INTRAVENOUS | Status: DC | PRN

## 2018-06-01 MED ORDER — PRENATAL MULTIVITAMIN CH
1.0000 | ORAL_TABLET | Freq: Every day | ORAL | Status: DC
  Administered 2018-06-02: 1 via ORAL
  Filled 2018-06-01: qty 1

## 2018-06-01 MED ORDER — TERBUTALINE SULFATE 1 MG/ML IJ SOLN: 0.2500 mg | Freq: Once | INTRAMUSCULAR | Status: DC | PRN

## 2018-06-01 MED ORDER — IBUPROFEN 600 MG PO TABS
600.0000 mg | ORAL_TABLET | Freq: Four times a day (QID) | ORAL | Status: DC
  Administered 2018-06-01 – 2018-06-02 (×4): 600 mg via ORAL
  Filled 2018-06-01 (×4): qty 1

## 2018-06-01 MED ORDER — OXYCODONE-ACETAMINOPHEN 5-325 MG PO TABS: 1.0000 | ORAL_TABLET | ORAL | Status: DC | PRN

## 2018-06-01 MED ORDER — SODIUM CHLORIDE (PF) 0.9 % IJ SOLN
INTRAMUSCULAR | Status: DC | PRN
  Administered 2018-06-01: 12 mL/h via EPIDURAL

## 2018-06-01 MED ORDER — METHYLERGONOVINE MALEATE 0.2 MG/ML IJ SOLN: 0.2000 mg | INTRAMUSCULAR | Status: DC | PRN

## 2018-06-01 MED ORDER — BENZOCAINE-MENTHOL 20-0.5 % EX AERO
1.0000 "application " | INHALATION_SPRAY | CUTANEOUS | Status: DC | PRN
  Administered 2018-06-01: 1 via TOPICAL
  Filled 2018-06-01: qty 56

## 2018-06-01 MED ORDER — SIMETHICONE 80 MG PO CHEW: 80.0000 mg | CHEWABLE_TABLET | ORAL | Status: DC | PRN

## 2018-06-01 MED ORDER — OXYTOCIN 40 UNITS IN NORMAL SALINE INFUSION - SIMPLE MED
1.0000 m[IU]/min | INTRAVENOUS | Status: DC
  Administered 2018-06-01: 2 m[IU]/min via INTRAVENOUS

## 2018-06-01 MED ORDER — PENICILLIN G 3 MILLION UNITS IVPB - SIMPLE MED
3.0000 10*6.[IU] | INTRAVENOUS | Status: DC
  Administered 2018-06-01: 3 10*6.[IU] via INTRAVENOUS
  Filled 2018-06-01: qty 100

## 2018-06-01 MED ORDER — LACTATED RINGERS IV SOLN: 500.0000 mL | Freq: Once | INTRAVENOUS | Status: DC

## 2018-06-01 MED ORDER — DIPHENHYDRAMINE HCL 50 MG/ML IJ SOLN: 12.5000 mg | INTRAMUSCULAR | Status: DC | PRN

## 2018-06-01 MED ORDER — ONDANSETRON HCL 4 MG/2ML IJ SOLN: 4.0000 mg | Freq: Four times a day (QID) | INTRAMUSCULAR | Status: DC | PRN

## 2018-06-01 MED ORDER — ONDANSETRON HCL 4 MG PO TABS: 4.0000 mg | ORAL_TABLET | ORAL | Status: DC | PRN

## 2018-06-01 MED ORDER — METHYLERGONOVINE MALEATE 0.2 MG PO TABS: 0.2000 mg | ORAL_TABLET | ORAL | Status: DC | PRN

## 2018-06-01 MED ORDER — LIDOCAINE HCL (PF) 1 % IJ SOLN: 30.0000 mL | INTRAMUSCULAR | Status: DC | PRN

## 2018-06-01 MED ORDER — SENNOSIDES-DOCUSATE SODIUM 8.6-50 MG PO TABS
2.0000 | ORAL_TABLET | ORAL | Status: DC
  Administered 2018-06-02: 2 via ORAL
  Filled 2018-06-01: qty 2

## 2018-06-01 MED ORDER — OXYTOCIN 40 UNITS IN NORMAL SALINE INFUSION - SIMPLE MED
2.5000 [IU]/h | INTRAVENOUS | Status: DC
  Filled 2018-06-01: qty 1000

## 2018-06-01 MED ORDER — WITCH HAZEL-GLYCERIN EX PADS: 1.0000 "application " | MEDICATED_PAD | CUTANEOUS | Status: DC | PRN

## 2018-06-01 MED ORDER — DIPHENHYDRAMINE HCL 25 MG PO CAPS: 25.0000 mg | ORAL_CAPSULE | Freq: Four times a day (QID) | ORAL | Status: DC | PRN

## 2018-06-01 MED ORDER — TETANUS-DIPHTH-ACELL PERTUSSIS 5-2.5-18.5 LF-MCG/0.5 IM SUSP: 0.5000 mL | Freq: Once | INTRAMUSCULAR | Status: DC

## 2018-06-01 MED ORDER — COCONUT OIL OIL: 1.0000 "application " | TOPICAL_OIL | Status: DC | PRN

## 2018-06-01 MED ORDER — LACTATED RINGERS IV SOLN
500.0000 mL | INTRAVENOUS | Status: DC | PRN
  Administered 2018-06-01: 500 mL via INTRAVENOUS

## 2018-06-01 MED ORDER — LACTATED RINGERS IV SOLN
INTRAVENOUS | Status: DC
  Administered 2018-06-01: 08:00:00 via INTRAVENOUS

## 2018-06-01 MED ORDER — OXYTOCIN 40 UNITS IN NORMAL SALINE INFUSION - SIMPLE MED: 1.0000 m[IU]/min | INTRAVENOUS | Status: DC

## 2018-06-01 MED ORDER — LIDOCAINE HCL (PF) 1 % IJ SOLN
INTRAMUSCULAR | Status: DC | PRN
  Administered 2018-06-01 (×2): 5 mL via EPIDURAL

## 2018-06-01 MED ORDER — FENTANYL-BUPIVACAINE-NACL 0.5-0.125-0.9 MG/250ML-% EP SOLN
12.0000 mL/h | EPIDURAL | Status: DC | PRN
  Filled 2018-06-01: qty 250

## 2018-06-01 NOTE — Anesthesia Procedure Notes (Signed)
Epidural Patient location during procedure: OB Start time: 06/01/2018 10:21 AM End time: 06/01/2018 10:30 AM  Staffing Anesthesiologist: Albertha Ghee, MD Performed: anesthesiologist   Preanesthetic Checklist Completed: patient identified, site marked, pre-op evaluation, timeout performed, IV checked, risks and benefits discussed and monitors and equipment checked  Epidural Patient position: sitting Prep: DuraPrep Patient monitoring: heart rate, cardiac monitor, continuous pulse ox and blood pressure Approach: midline Location: L2-L3 Injection technique: LOR saline  Needle:  Needle type: Tuohy  Needle gauge: 17 G Needle length: 9 cm Needle insertion depth: 4 cm Catheter type: closed end flexible Catheter size: 19 Gauge Catheter at skin depth: 11 cm Test dose: negative and Other  Assessment Events: blood not aspirated, injection not painful, no injection resistance and negative IV test  Additional Notes Informed consent obtained prior to proceeding including risk of failure, 1% risk of PDPH, risk of minor discomfort and bruising.  Discussed rare but serious complications including epidural abscess, permanent nerve injury, epidural hematoma.  Discussed alternatives to epidural analgesia and patient desires to proceed.  Timeout performed pre-procedure verifying patient name, procedure, and platelet count.  Patient tolerated procedure well. Reason for block:procedure for pain

## 2018-06-01 NOTE — Anesthesia Preprocedure Evaluation (Signed)
Anesthesia Evaluation  Patient identified by MRN, date of birth, ID band Patient awake    Reviewed: Allergy & Precautions, H&P , NPO status , Patient's Chart, lab work & pertinent test results  Airway Mallampati: II   Neck ROM: full    Dental   Pulmonary neg pulmonary ROS,    breath sounds clear to auscultation       Cardiovascular negative cardio ROS   Rhythm:regular Rate:Normal     Neuro/Psych    GI/Hepatic   Endo/Other    Renal/GU      Musculoskeletal   Abdominal   Peds  Hematology   Anesthesia Other Findings   Reproductive/Obstetrics (+) Pregnancy                             Anesthesia Physical Anesthesia Plan  ASA: I  Anesthesia Plan: Epidural   Post-op Pain Management:    Induction: Intravenous  PONV Risk Score and Plan: 2 and Treatment may vary due to age or medical condition  Airway Management Planned: Natural Airway  Additional Equipment:   Intra-op Plan:   Post-operative Plan:   Informed Consent: I have reviewed the patients History and Physical, chart, labs and discussed the procedure including the risks, benefits and alternatives for the proposed anesthesia with the patient or authorized representative who has indicated his/her understanding and acceptance.       Plan Discussed with: Anesthesiologist  Anesthesia Plan Comments:         Anesthesia Quick Evaluation  

## 2018-06-01 NOTE — H&P (Signed)
Erin Thompson is a 43 y.o. female presenting for IOL for AMA  43 yo G3P1011 @ 39+2 presents for IOL for AMA. Her pregnancy has been otherwise uncomplicated. Urinary tract dilitation was noted on her anatomy scan. This was resolved on a subsequent US OB History    Gravida  3   Para  1   Term  1   Preterm      AB  1   Living  1     SAB      TAB      Ectopic  0   Multiple  0   Live Births  1          Past Medical History:  Diagnosis Date  . Depression    pt denies  . Fracture of right wrist   . Hemorrhoids   . History of chlamydia   . Tubular adenoma of colon    Past Surgical History:  Procedure Laterality Date  . BREAST SURGERY     Bi-lat Implants  . HEMORRHOID BANDING    . HERNIA REPAIR     AT BIRTH  . WRIST SURGERY     Family History: family history includes Breast cancer in her maternal aunt and paternal aunt; Cancer in her maternal grandmother; Diabetes in her father, maternal grandmother, and mother; Hypertension in her father and mother; Throat cancer in her maternal grandmother. Social History:  reports that she has never smoked. She has never used smokeless tobacco. She reports current alcohol use. She reports that she does not use drugs.     Maternal Diabetes: No Genetic Screening: Normal Maternal Ultrasounds/Referrals: Normal Fetal Ultrasounds or other Referrals:  None Maternal Substance Abuse:  No Significant Maternal Medications:  None Significant Maternal Lab Results:  None Other Comments:  None  ROS History Dilation: 4 Effacement (%): 70 Station: -2 Exam by:: Dr. Harrington Challenger Blood pressure 117/77, pulse 62, temperature 98.1 F (36.7 C), temperature source Oral, resp. rate 18, height 5\' 9"  (1.753 m), weight 85.3 kg, last menstrual period 08/30/2017, SpO2 100 %, not currently breastfeeding. Exam Physical Exam  Prenatal labs: ABO, Rh: --/--/O POS, O POS Performed at [facility], [place] [address] [place], [place] [address]  [phone]  0815) Antibody: NEG (03/25 0815) Rubella: Immune (09/06 0000) RPR: Non Reactive (03/25 0751)  HBsAg: Negative (09/06 0000)  HIV: Non-reactive (09/06 0000)  GBS: Positive (03/04 0000)   Assessment/Plan: 1) Admit 2) PCN for gbs 3) Pitocin 4) AROM when able 5) Epidural on request   Vanessa Kick 06/01/2018, 1:00 PM

## 2018-06-01 NOTE — Anesthesia Postprocedure Evaluation (Signed)
Anesthesia Post Note  Patient: Erin Thompson  Procedure(s) Performed: AN AD [facility]     Patient location during evaluation: Mother Baby Anesthesia Type: Epidural Level of consciousness: awake Pain management: satisfactory to patient Vital Signs Assessment: post-procedure vital signs reviewed and stable Respiratory status: spontaneous breathing Cardiovascular status: stable Anesthetic complications: no    Last Vitals:  Vitals:   06/01/18 1545 06/01/18 1645  BP: 113/72 97/73  Pulse: 62 67  Resp: 18 17  Temp: 37 C 36.9 C  SpO2:      Last Pain:  Vitals:   06/01/18 1645  TempSrc: Oral  PainSc: 0-No pain   Pain Goal:                   [employer]

## 2018-06-02 LAB — CBC
HCT: 36.2 % (ref 36.0–46.0)
Hemoglobin: 12.2 g/dL (ref 12.0–15.0)
MCH: 30.5 pg (ref 26.0–34.0)
MCHC: 33.7 g/dL (ref 30.0–36.0)
MCV: 90.5 fL (ref 80.0–100.0)
Platelets: 150 10*3/uL (ref 150–400)
RBC: 4 MIL/uL (ref 3.87–5.11)
RDW: 13.5 % (ref 11.5–15.5)
WBC: 9.3 10*3/uL (ref 4.0–10.5)
nRBC: 0 % (ref 0.0–0.2)

## 2018-06-02 LAB — ABO/RH: ABO/RH(D): O POS

## 2018-06-02 MED ORDER — IBUPROFEN 600 MG PO TABS: 600.0000 mg | ORAL_TABLET | Freq: Four times a day (QID) | ORAL | 0 refills | Status: DC

## 2018-06-02 NOTE — Discharge Summary (Signed)
Obstetric Discharge Summary Reason for Admission: induction of labor Prenatal Procedures: none Intrapartum Procedures: spontaneous vaginal delivery Postpartum Procedures: none Complications-Operative and Postpartum: 2 degree perineal laceration Hemoglobin  Date Value Ref Range Status  06/02/2018 12.2 12.0 - 15.0 g/dL Final   HCT  Date Value Ref Range Status  06/02/2018 36.2 36.0 - 46.0 % Final    Physical Exam:  General: alert, cooperative and appears stated age 43: appropriate Uterine Fundus: firm DVT Evaluation: No evidence of DVT seen on physical exam.  Discharge Diagnoses: Term Pregnancy-delivered  Discharge Information: Date: 06/02/2018 Activity: pelvic rest Diet: routine Medications: PNV and Ibuprofen Condition: stable Instructions: refer to practice specific booklet Discharge to: home Follow-up Information    Vanessa Kick, MD Follow up in 4 week(s).   Specialty:  Obstetrics and Gynecology Contact information: [place] [address] [phone]           Newborn Data: Live born female  Birth Weight: 8 lb 7.8 oz (3850 g) APGAR: 64, 8  Newborn Delivery   Birth date/time:  06/01/2018 13:40:00 Delivery type:  Vaginal, Spontaneous     Home with mother.  [place] 06/02/2018, 12:41 PM

## 2018-06-02 NOTE — Progress Notes (Signed)
Patient is doing well.  She is ambulating, voiding, tolerating PO.  Pain control is good.  Lochia is appropriate  Vitals:   06/01/18 1645 06/01/18 2110 06/02/18 0130 06/02/18 0540  BP: 97/73 122/71 (!) 93/52 122/82  Pulse: 67 (!) 55 60 64  Resp: 17 18 18 16   Temp: 98.5 F (36.9 C) 98.2 F (36.8 C) 98.3 F (36.8 C) 98.3 F (36.8 C)  TempSrc: Oral Oral Oral Oral  SpO2:      Weight:      Height:        NAD Fundus firm Ext: trace edema b/l  Lab Results  Component Value Date   WBC 9.3 06/02/2018   HGB 12.2 06/02/2018   HCT 36.2 06/02/2018   MCV 90.5 06/02/2018   PLT 150 06/02/2018    --/--/O POS, O POS Performed at [facility] [address], [place],  [address]  (03/25 0815)/[facility]  A/P 43 y.o. D5T4707 PPD#1 s/p TSVD. Routine care.   Meeting all goals.  Discharge to home today.  Desires circumcision. Discussed r/b/a of the procedure. Reviewed that circumcision is an elective surgical procedure and not considered medically necessary. Reviewed the risks of the procedure including the risk of infection, bleeding, damage to surrounding structures, including scrotum, shaft, urethra and head of penis, and an undesired cosmetic effect requiring additional procedures for revision. Consent signed.      [place]

## 2018-11-29 ENCOUNTER — Encounter: Payer: Self-pay | Admitting: Gynecology

## 2019-03-22 LAB — HM MAMMOGRAPHY

## 2019-05-04 NOTE — Progress Notes (Signed)
Subjective:    Patient ID: Erin Thompson, female    DOB: [birth year], 44 y.o.   MRN: [id number]  HPI She is here for a physical exam.   She is exercising 3/week with a trainer.  She eats clean and healthy.    She gets good sleep -7-8 hrs.  She often gets up once to feed her baby.  She feels tired all the time.  She is fatigued throughout the day.  This started about 2 months ago.  She does not feel depressed, but has a history of depression.     Medications and allergies reviewed with patient and updated if appropriate.  Patient Active Problem List   Diagnosis Date Noted  . Fatigue 05/05/2019  . Colon polyp, precancerous 01/14/2017  . Anxiety and depression 12/28/2011  . Renal abscess, right 12/28/2011  . DUB (dysfunctional uterine bleeding) 12/28/2011    Current Outpatient Medications on File Prior to Visit  Medication Sig Dispense Refill  . cholecalciferol (VITAMIN D3) 25 MCG (1000 UNIT) tablet Take 1,000 Units by mouth daily.    Marland Kitchen ELDERBERRY PO Take by mouth.    . Multiple Vitamins-Minerals (MULTIVITAMIN WITH MINERALS) tablet Take 1 tablet by mouth daily.     No current facility-administered medications on file prior to visit.    Past Medical History:  Diagnosis Date  . Depression    pt denies  . Fracture of right wrist   . Hemorrhoids   . History of chlamydia   . Tubular adenoma of colon     Past Surgical History:  Procedure Laterality Date  . BREAST SURGERY     Bi-lat Implants  . HEMORRHOID BANDING    . HERNIA REPAIR     AT BIRTH  . WRIST SURGERY      Social History   Socioeconomic History  . Marital status: Married    Spouse name: Not on file  . Number of children: Not on file  . Years of education: Not on file  . Highest education level: Not on file  Occupational History  . Not on file  Tobacco Use  . Smoking status: Never Smoker  . Smokeless tobacco: Never Used  Substance and Sexual Activity  . Alcohol use: Yes    Alcohol/week: 0.0  standard drinks    Comment: occasionally  . Drug use: No  . Sexual activity: Yes    Partners: Male    Birth control/protection: None  Other Topics Concern  . Not on file  Social History Narrative  . Not on file   Social Determinants of Health   Financial Resource Strain: Low Risk   . Difficulty of Paying Living Expenses: Not hard at all  Food Insecurity: No Food Insecurity  . Worried About Charity fundraiser in the Last Year: Never true  . Ran Out of Food in the Last Year: Never true  Transportation Needs: Unknown  . Lack of Transportation (Medical): No  . Lack of Transportation (Non-Medical): Not on file  Physical Activity: Inactive  . Days of Exercise per Week: 0 days  . Minutes of Exercise per Session: 0 min  Stress: No Stress Concern Present  . Feeling of Stress : Not at all  Social Connections: Somewhat Isolated  . Frequency of Communication with Friends and Family: Once a week  . Frequency of Social Gatherings with Friends and Family: Once a week  . Attends Religious Services: More than 4 times per year  . Active Member of Clubs or Organizations: No  .  Attends Archivist Meetings: Never  . Marital Status: Married    Family History  Problem Relation Age of Onset  . Hypertension Mother   . Diabetes Mother   . Diabetes Maternal Grandmother   . Cancer Maternal Grandmother        Thyroid  . Throat cancer Maternal Grandmother   . Diabetes Father   . Hypertension Father   . Breast cancer Maternal Aunt   . Breast cancer Paternal Aunt   . Colon cancer Neg Hx   . Esophageal cancer Neg Hx   . Pancreatic cancer Neg Hx   . Prostate cancer Neg Hx   . Rectal cancer Neg Hx   . Stomach cancer Neg Hx     Review of Systems  Constitutional: Positive for fatigue. Negative for chills and fever.  Eyes: Negative for visual disturbance.  Respiratory: Positive for cough. Negative for chest tightness, shortness of breath and wheezing.   Cardiovascular: Negative for  chest pain, palpitations and leg swelling.  Gastrointestinal: Negative for abdominal pain, blood in stool (no melana), constipation, diarrhea and nausea.       No gerd  Genitourinary: Negative for dysuria and hematuria.  Musculoskeletal: Negative for arthralgias and back pain.  Skin: Negative for color change and rash.  Neurological: Negative for light-headedness, numbness and headaches.  Hematological: Does not bruise/bleed easily.  Psychiatric/Behavioral: Negative for dysphoric mood. The patient is not nervous/anxious.        Objective:   Vitals:   05/05/19 1302  BP: 108/68  Pulse: 74  Resp: 16  Temp: 98.6 F (37 C)  SpO2: 99%   Filed Weights   05/05/19 1302  Weight: 146 lb (66.2 kg)   Body mass index is 21.56 kg/m.  BP Readings from Last 3 Encounters:  05/05/19 108/68  06/02/18 122/82  01/04/17 127/86    Wt Readings from Last 3 Encounters:  05/05/19 146 lb (66.2 kg)  06/01/18 188 lb (85.3 kg)  01/04/17 148 lb (67.1 kg)     Physical Exam Constitutional: She appears well-developed and well-nourished. No distress.  HENT:  Head: Normocephalic and atraumatic.  Right Ear: External ear normal. Normal ear canal and TM Left Ear: External ear normal.  Normal ear canal and TM Mouth/Throat: Oropharynx is clear and moist.  Eyes: Conjunctivae and EOM are normal.  Neck: Neck supple. No tracheal deviation present. No thyromegaly present.  No carotid bruit  Cardiovascular: Normal rate, regular rhythm and normal heart sounds.   No murmur heard.  No edema. Pulmonary/Chest: Effort normal and breath sounds normal. No respiratory distress. She has no wheezes. She has no rales.  Breast: deferred   Abdominal: Soft. She exhibits no distension. There is no tenderness.  Lymphadenopathy: She has no cervical adenopathy.  Skin: Skin is warm and dry. She is not diaphoretic.  Psychiatric: She has a normal mood and affect. Her behavior is normal.        Assessment & Plan:    Physical exam: Screening blood work    ordered Immunizations   Deferred flu, td up to date Mammogram  Up to date  Colonoscopy   Up to date  Gyn  Up to date  Exercise  regular Weight  Normal BMI Substance abuse   none  See Problem List for Assessment and Plan of chronic medical problems.   This visit occurred during the SARS-CoV-2 public health emergency.  Safety protocols were in place, including screening questions prior to the visit, additional usage of staff PPE, and extensive cleaning  of exam room while observing appropriate contact time as indicated for disinfecting solutions.

## 2019-05-04 NOTE — Patient Instructions (Addendum)
Blood work was ordered.    All other Health Maintenance issues reviewed.   All recommended immunizations and age-appropriate screenings are up-to-date or discussed.  No immunization administered today.   Medications reviewed and updated.  Changes include :   none     Please followup in 1 year    Health Maintenance, Female Adopting a healthy lifestyle and getting preventive care are important in promoting health and wellness. Ask your health care provider about:  The right schedule for you to have regular tests and exams.  Things you can do on your own to prevent diseases and keep yourself healthy. What should I know about diet, weight, and exercise? Eat a healthy diet   Eat a diet that includes plenty of vegetables, fruits, low-fat dairy products, and lean protein.  Do not eat a lot of foods that are high in solid fats, added sugars, or sodium. Maintain a healthy weight Body mass index (BMI) is used to identify weight problems. It estimates body fat based on height and weight. Your health care provider can help determine your BMI and help you achieve or maintain a healthy weight. Get regular exercise Get regular exercise. This is one of the most important things you can do for your health. Most adults should:  Exercise for at least 150 minutes each week. The exercise should increase your heart rate and make you sweat (moderate-intensity exercise).  Do strengthening exercises at least twice a week. This is in addition to the moderate-intensity exercise.  Spend less time sitting. Even light physical activity can be beneficial. Watch cholesterol and blood lipids Have your blood tested for lipids and cholesterol at 42 years of age, then have this test every 5 years. Have your cholesterol levels checked more often if:  Your lipid or cholesterol levels are high.  You are older than 42 years of age.  You are at high risk for heart disease. What should I know about cancer  screening? Depending on your health history and family history, you may need to have cancer screening at various ages. This may include screening for:  Breast cancer.  Cervical cancer.  Colorectal cancer.  Skin cancer.  Lung cancer. What should I know about heart disease, diabetes, and high blood pressure? Blood pressure and heart disease  High blood pressure causes heart disease and increases the risk of stroke. This is more likely to develop in people who have high blood pressure readings, are of African descent, or are overweight.  Have your blood pressure checked: ? Every 3-5 years if you are 18-39 years of age. ? Every year if you are 40 years old or older. Diabetes Have regular diabetes screenings. This checks your fasting blood sugar level. Have the screening done:  Once every three years after age 40 if you are at a normal weight and have a low risk for diabetes.  More often and at a younger age if you are overweight or have a high risk for diabetes. What should I know about preventing infection? Hepatitis B If you have a higher risk for hepatitis B, you should be screened for this virus. Talk with your health care provider to find out if you are at risk for hepatitis B infection. Hepatitis C Testing is recommended for:  Everyone born from 1945 through 1965.  Anyone with known risk factors for hepatitis C. Sexually transmitted infections (STIs)  Get screened for STIs, including gonorrhea and chlamydia, if: ? You are sexually active and are younger than 42 years   of age. ? You are older than 42 years of age and your health care provider tells you that you are at risk for this type of infection. ? Your sexual activity has changed since you were last screened, and you are at increased risk for chlamydia or gonorrhea. Ask your health care provider if you are at risk.  Ask your health care provider about whether you are at high risk for HIV. Your health care provider may  recommend a prescription medicine to help prevent HIV infection. If you choose to take medicine to prevent HIV, you should first get tested for HIV. You should then be tested every 3 months for as long as you are taking the medicine. Pregnancy  If you are about to stop having your period (premenopausal) and you may become pregnant, seek counseling before you get pregnant.  Take 400 to 800 micrograms (mcg) of folic acid every day if you become pregnant.  Ask for birth control (contraception) if you want to prevent pregnancy. Osteoporosis and menopause Osteoporosis is a disease in which the bones lose minerals and strength with aging. This can result in bone fractures. If you are 65 years old or older, or if you are at risk for osteoporosis and fractures, ask your health care provider if you should:  Be screened for bone loss.  Take a calcium or vitamin D supplement to lower your risk of fractures.  Be given hormone replacement therapy (HRT) to treat symptoms of menopause. Follow these instructions at home: Lifestyle  Do not use any products that contain nicotine or tobacco, such as cigarettes, e-cigarettes, and chewing tobacco. If you need help quitting, ask your health care provider.  Do not use street drugs.  Do not share needles.  Ask your health care provider for help if you need support or information about quitting drugs. Alcohol use  Do not drink alcohol if: ? Your health care provider tells you not to drink. ? You are pregnant, may be pregnant, or are planning to become pregnant.  If you drink alcohol: ? Limit how much you use to 0-1 drink a day. ? Limit intake if you are breastfeeding.  Be aware of how much alcohol is in your drink. In the [place] one drink equals one 12 oz bottle of beer (355 mL), one 5 oz glass of wine (148 mL), or one 1 oz glass of hard liquor (44 mL). General instructions  Schedule regular health, dental, and eye exams.  Stay current with your  vaccines.  Tell your health care provider if: ? You often feel depressed. ? You have ever been abused or do not feel safe at home. Summary  Adopting a healthy lifestyle and getting preventive care are important in promoting health and wellness.  Follow your health care provider's instructions about healthy diet, exercising, and getting tested or screened for diseases.  Follow your health care provider's instructions on monitoring your cholesterol and blood pressure. This information is not intended to replace advice given to you by your health care provider. Make sure you discuss any questions you have with your health care provider. Document Revised: 02/16/2018 Document Reviewed: 02/16/2018 Elsevier Patient Education  2020 [employer]  

## 2019-05-05 ENCOUNTER — Ambulatory Visit (INDEPENDENT_AMBULATORY_CARE_PROVIDER_SITE_OTHER): Payer: 59 | Admitting: Internal Medicine

## 2019-05-05 ENCOUNTER — Other Ambulatory Visit: Payer: Self-pay

## 2019-05-05 ENCOUNTER — Encounter: Payer: Self-pay | Admitting: Internal Medicine

## 2019-05-05 VITALS — BP 108/68 | HR 74 | Temp 98.6°F | Resp 16 | Ht 69.0 in | Wt 146.0 lb

## 2019-05-05 DIAGNOSIS — Z8639 Personal history of other endocrine, nutritional and metabolic disease: Secondary | ICD-10-CM | POA: Diagnosis not present

## 2019-05-05 DIAGNOSIS — R5383 Other fatigue: Secondary | ICD-10-CM | POA: Insufficient documentation

## 2019-05-05 DIAGNOSIS — Z0000 Encounter for general adult medical examination without abnormal findings: Secondary | ICD-10-CM

## 2019-05-05 LAB — COMPREHENSIVE METABOLIC PANEL
ALT: 11 U/L (ref 0–35)
AST: 17 U/L (ref 0–37)
Albumin: 4.5 g/dL (ref 3.5–5.2)
Alkaline Phosphatase: 62 U/L (ref 39–117)
BUN: 22 mg/dL (ref 6–23)
CO2: 31 mEq/L (ref 19–32)
Calcium: 10 mg/dL (ref 8.4–10.5)
Chloride: 103 mEq/L (ref 96–112)
Creatinine, Ser: 0.97 mg/dL (ref 0.40–1.20)
GFR: 63.19 mL/min (ref >60.00)
Glucose, Bld: 103 mg/dL — ABNORMAL HIGH (ref 70–99)
Potassium: 4.2 mEq/L (ref 3.5–5.1)
Sodium: 139 mEq/L (ref 135–145)
Total Bilirubin: 0.7 mg/dL (ref 0.2–1.2)
Total Protein: 7.5 g/dL (ref 6.0–8.3)

## 2019-05-05 LAB — LIPID PANEL
Cholesterol: 143 mg/dL (ref 0–200)
HDL: 52.2 mg/dL (ref >39.00)
LDL Cholesterol: 75 mg/dL (ref 0–99)
NonHDL: 90.72
Total CHOL/HDL Ratio: 3
Triglycerides: 81 mg/dL (ref 0.0–149.0)
VLDL: 16.2 mg/dL (ref 0.0–40.0)

## 2019-05-05 LAB — CBC WITH DIFFERENTIAL/PLATELET
Basophils Absolute: 0.1 10*3/uL (ref 0.0–0.1)
Basophils Relative: 1 % (ref 0.0–3.0)
Eosinophils Absolute: 0 10*3/uL (ref 0.0–0.7)
Eosinophils Relative: 0.6 % (ref 0.0–5.0)
HCT: 43.4 % (ref 36.0–46.0)
Hemoglobin: 14.3 g/dL (ref 12.0–15.0)
Lymphocytes Relative: 25.8 % (ref 12.0–46.0)
Lymphs Abs: 1.7 10*3/uL (ref 0.7–4.0)
MCHC: 33 g/dL (ref 30.0–36.0)
MCV: 91.7 fl (ref 78.0–100.0)
Monocytes Absolute: 0.7 10*3/uL (ref 0.1–1.0)
Monocytes Relative: 10.6 % (ref 3.0–12.0)
Neutro Abs: 4.2 10*3/uL (ref 1.4–7.7)
Neutrophils Relative %: 62 % (ref 43.0–77.0)
Platelets: 203 10*3/uL (ref 150.0–400.0)
RBC: 4.74 Mil/uL (ref 3.87–5.11)
RDW: 13 % (ref 11.5–15.5)
WBC: 6.7 10*3/uL (ref 4.0–10.5)

## 2019-05-05 LAB — VITAMIN B12: Vitamin B-12: 509 pg/mL (ref 211–911)

## 2019-05-05 LAB — TSH: TSH: 1.55 u[IU]/mL (ref 0.35–4.50)

## 2019-05-05 NOTE — Assessment & Plan Note (Signed)
Check iron given fatigue

## 2019-05-05 NOTE — Assessment & Plan Note (Signed)
Having at least 2 months ago fatigue - ??? Why ? Depression Check labs to r/o other causes - tsh, iron, B12, cbc, cmp No other concerning symptoms

## 2019-05-06 ENCOUNTER — Encounter: Payer: Self-pay | Admitting: Internal Medicine

## 2019-05-06 LAB — IRON,TIBC AND FERRITIN PANEL
%SAT: 32 % (calc) (ref 16–45)
Ferritin: 52 ng/mL (ref 16–232)
Iron: 86 ug/dL (ref 40–190)
TIBC: 268 mcg/dL (calc) (ref 250–450)

## 2019-06-20 ENCOUNTER — Other Ambulatory Visit: Payer: Self-pay

## 2019-06-20 ENCOUNTER — Encounter: Payer: Self-pay | Admitting: Internal Medicine

## 2019-06-20 ENCOUNTER — Telehealth: Payer: Self-pay

## 2019-06-20 ENCOUNTER — Ambulatory Visit (INDEPENDENT_AMBULATORY_CARE_PROVIDER_SITE_OTHER): Payer: 59 | Admitting: Internal Medicine

## 2019-06-20 ENCOUNTER — Other Ambulatory Visit: Payer: Self-pay | Admitting: Internal Medicine

## 2019-06-20 DIAGNOSIS — F419 Anxiety disorder, unspecified: Secondary | ICD-10-CM

## 2019-06-20 DIAGNOSIS — F329 Major depressive disorder, single episode, unspecified: Secondary | ICD-10-CM | POA: Diagnosis not present

## 2019-06-20 DIAGNOSIS — F32A Depression, unspecified: Secondary | ICD-10-CM

## 2019-06-20 MED ORDER — ESCITALOPRAM OXALATE 10 MG PO TABS: 10.0000 mg | ORAL_TABLET | Freq: Every day | ORAL | 5 refills | Status: DC

## 2019-06-20 NOTE — Telephone Encounter (Signed)
Called pt no answer LMOM w/MD response../lmb 

## 2019-06-20 NOTE — Patient Instructions (Addendum)
Medications reviewed and updated.  Changes include :   Start lexapro 10 mg daily  Your prescription(s) have been submitted to your pharmacy. Please take as directed and contact our office if you believe you are having problem(s) with the medication(s).    Please followup in 3 months, sooner if needed

## 2019-06-20 NOTE — Telephone Encounter (Signed)
Called pt she states she spoke w/MD before about going back on the Pristiq or something. She states she really need something. She states she is very moody, hateful, she has no patience at all. Pls advise.Marland KitchenJohny Chess

## 2019-06-20 NOTE — Progress Notes (Signed)
Subjective:    Patient ID: Erin Thompson, female    DOB: 10/30/1975, 44 y.o.   MRN: [id number]  HPI The patient is here for an acute visit.  She feels anxious and some depressed at times.   She still feels fatigued.  If she is not fatigued she is hyper.  She finds herself to be mean and screaming at her family.   She feels like some of this is hormonal and does not understand why she feels this way.  She continues to exercise regularly and is eating really well.  She feels like she is on PMS all the time.  She was just placed on birth control two weeks ago.  She was hoping this would help, but she really does not want to be on birth control.  She takes melatonin at night.  She has difficulty falling asleep and staying asleep.  She was on Pristiq years ago for depression and was on several different medications a lot of which did not work.  Medications and allergies reviewed with patient and updated if appropriate.  Patient Active Problem List   Diagnosis Date Noted  . Fatigue 05/05/2019  . History of iron deficiency 05/05/2019  . Colon polyp, precancerous 01/14/2017  . Anxiety and depression 12/28/2011  . Renal abscess, right 12/28/2011    Current Outpatient Medications on File Prior to Visit  Medication Sig Dispense Refill  . cholecalciferol (VITAMIN D3) 25 MCG (1000 UNIT) tablet Take 1,000 Units by mouth daily.    Marland Kitchen ELDERBERRY PO Take by mouth.    . Multiple Vitamins-Minerals (MULTIVITAMIN WITH MINERALS) tablet Take 1 tablet by mouth daily.    . norethindrone-ethinyl estradiol (JUNEL FE 1/20) 1-20 MG-MCG tablet Junel FE 1/20 (28) 1 mg-20 mcg (21)/75 mg (7) tablet  Take 1 tablet every day by oral route.     No current facility-administered medications on file prior to visit.    Past Medical History:  Diagnosis Date  . Depression    pt denies  . Fracture of right wrist   . Hemorrhoids   . History of chlamydia   . Tubular adenoma of colon     Past Surgical  History:  Procedure Laterality Date  . BREAST SURGERY     Bi-lat Implants  . HEMORRHOID BANDING    . HERNIA REPAIR     AT BIRTH  . WRIST SURGERY      Social History   Socioeconomic History  . Marital status: Married    Spouse name: Not on file  . Number of children: Not on file  . Years of education: Not on file  . Highest education level: Not on file  Occupational History  . Not on file  Tobacco Use  . Smoking status: Never Smoker  . Smokeless tobacco: Never Used  Substance and Sexual Activity  . Alcohol use: Yes    Alcohol/week: 0.0 standard drinks    Comment: occasionally  . Drug use: No  . Sexual activity: Yes    Partners: Male    Birth control/protection: None  Other Topics Concern  . Not on file  Social History Narrative  . Not on file   Social Determinants of Health   Financial Resource Strain:   . Difficulty of Paying Living Expenses:   Food Insecurity:   . Worried About Charity fundraiser in the Last Year:   . Arboriculturist in the Last Year:   Transportation Needs:   . Film/video editor (Medical):   Marland Kitchen  Lack of Transportation (Non-Medical):   Physical Activity:   . Days of Exercise per Week:   . Minutes of Exercise per Session:   Stress:   . Feeling of Stress :   Social Connections:   . Frequency of Communication with Friends and Family:   . Frequency of Social Gatherings with Friends and Family:   . Attends Religious Services:   . Active Member of Clubs or Organizations:   . Attends Archivist Meetings:   Marland Kitchen Marital Status:     Family History  Problem Relation Age of Onset  . Hypertension Mother   . Diabetes Mother   . Diabetes Maternal Grandmother   . Cancer Maternal Grandmother        Thyroid  . Throat cancer Maternal Grandmother   . Diabetes Father   . Hypertension Father   . Breast cancer Maternal Aunt   . Breast cancer Paternal Aunt   . Colon cancer Neg Hx   . Esophageal cancer Neg Hx   . Pancreatic cancer Neg Hx    . Prostate cancer Neg Hx   . Rectal cancer Neg Hx   . Stomach cancer Neg Hx     Review of Systems  Constitutional: Negative for appetite change.  Respiratory: Negative for shortness of breath.   Cardiovascular: Negative for chest pain and palpitations.  Psychiatric/Behavioral: Positive for dysphoric mood and sleep disturbance (diff falling and staying asleep). The patient is nervous/anxious.        Objective:   Vitals:   06/20/19 1347  BP: 112/74  Pulse: (!) 56  Resp: 16  Temp: 98.4 F (36.9 C)  SpO2: 98%   BP Readings from Last 3 Encounters:  06/20/19 112/74  05/05/19 108/68  06/02/18 122/82   Wt Readings from Last 3 Encounters:  06/20/19 141 lb (64 kg)  05/05/19 146 lb (66.2 kg)  06/01/18 188 lb (85.3 kg)   Body mass index is 20.82 kg/m.    Depression screen PHQ 2/9 06/20/2019  Decreased Interest 1  Down, Depressed, Hopeless 0  PHQ - 2 Score 1  Altered sleeping 2  Tired, decreased energy 3  Change in appetite 0  Feeling bad or failure about yourself  0  Trouble concentrating 1  Moving slowly or fidgety/restless 0  Suicidal thoughts 0  PHQ-9 Score 7      Physical Exam Constitutional:      General: She is not in acute distress.    Appearance: Normal appearance. She is not ill-appearing.  HENT:     Head: Normocephalic and atraumatic.  Skin:    General: Skin is warm and dry.  Neurological:     Mental Status: She is alert and oriented to person, place, and time.  Psychiatric:        Behavior: Behavior normal.        Thought Content: Thought content normal.        Judgment: Judgment normal.     Comments: Mood slightly depressed, anxious at times            Assessment & Plan:    See Problem List for Assessment and Plan of chronic medical problems.    This visit occurred during the SARS-CoV-2 public health emergency.  Safety protocols were in place, including screening questions prior to the visit, additional usage of staff PPE, and  extensive cleaning of exam room while observing appropriate contact time as indicated for disinfecting solutions.

## 2019-06-20 NOTE — Assessment & Plan Note (Signed)
Acute She is experiencing depression, anxiety and irritability She is having some disruption in her sleep pattern She was recently started on birth control to see if that would help-it does feel hormonal, but she would prefer not to be on birth control She think she needs to start an SSRI, but does not really want to She has been on several medications in the past-many of them did not work She was on Lexapro and thinks that may have been okay Start Lexapro 10 mg daily Can consider adding Wellbutrin in the near future Advised her to update me via MyChart Follow-up in 3 months, sooner if needed

## 2019-06-20 NOTE — Telephone Encounter (Signed)
New message    The patient is asking the CMA to call her back to discuss previous medication Pristiq medication

## 2019-06-20 NOTE — Telephone Encounter (Signed)
Please schedule an appt - I have never prescribed her medication for depression/anxiety and she needs an appt.

## 2019-06-27 ENCOUNTER — Encounter: Payer: Self-pay | Admitting: Internal Medicine

## 2020-03-13 ENCOUNTER — Ambulatory Visit: Payer: 59 | Admitting: Gastroenterology

## 2020-06-13 NOTE — Patient Instructions (Addendum)
  Blood work was ordered.     Medications changes include :   none     

## 2020-06-13 NOTE — Progress Notes (Signed)
Subjective:    Patient ID: Erin Thompson, female    DOB: 02-28-1976, 45 y.o.   MRN: [id number]   This visit occurred during the SARS-CoV-2 public health emergency.  Safety protocols were in place, including screening questions prior to the visit, additional usage of staff PPE, and extensive cleaning of exam room while observing appropriate contact time as indicated for disinfecting solutions.    HPI She is here for pre-operative clearance at the request of Dr Leanne Lovely for breast implants under general anesthesia scheduled for 07/16/2020.   She denies any personal or family history of problems with anesthesia or bleeding/blood clot problems.    She has no concerns.  She is taking all her medication as prescribed.   She is exercising regularly.  With her daily activities she denies chest pain, palpitations, SOB and lightheadedness.    She currently only takes supplements.    Medications and allergies reviewed with patient and updated if appropriate.  Patient Active Problem List   Diagnosis Date Noted  . History of iron deficiency 05/05/2019  . Colon polyp, precancerous 01/14/2017  . Renal abscess, right 12/28/2011    Current Outpatient Medications on File Prior to Visit  Medication Sig Dispense Refill  . cholecalciferol (VITAMIN D3) 25 MCG (1000 UNIT) tablet Take 1,000 Units by mouth daily.    Marland Kitchen ELDERBERRY PO Take by mouth.    . Multiple Vitamins-Minerals (MULTIVITAMIN WITH MINERALS) tablet Take 1 tablet by mouth daily.     No current facility-administered medications on file prior to visit.    Past Medical History:  Diagnosis Date  . Depression    pt denies  . Fracture of right wrist   . Hemorrhoids   . History of chlamydia   . Tubular adenoma of colon     Past Surgical History:  Procedure Laterality Date  . BREAST SURGERY     Bi-lat Implants  . HEMORRHOID BANDING    . HERNIA REPAIR     AT BIRTH  . WRIST SURGERY      Social History    Socioeconomic History  . Marital status: Married    Spouse name: Not on file  . Number of children: Not on file  . Years of education: Not on file  . Highest education level: Not on file  Occupational History  . Not on file  Tobacco Use  . Smoking status: Never Smoker  . Smokeless tobacco: Never Used  Vaping Use  . Vaping Use: Never used  Substance and Sexual Activity  . Alcohol use: Yes    Alcohol/week: 0.0 standard drinks    Comment: occasionally  . Drug use: No  . Sexual activity: Yes    Partners: Male    Birth control/protection: None  Other Topics Concern  . Not on file  Social History Narrative  . Not on file   Social Determinants of Health   Financial Resource Strain: Not on file  Food Insecurity: Not on file  Transportation Needs: Not on file  Physical Activity: Not on file  Stress: Not on file  Social Connections: Not on file    Family History  Problem Relation Age of Onset  . Hypertension Mother   . Diabetes Mother   . Diabetes Maternal Grandmother   . Cancer Maternal Grandmother        Thyroid  . Throat cancer Maternal Grandmother   . Diabetes Father   . Hypertension Father   . Breast cancer Maternal Aunt   . Breast cancer  Paternal Aunt   . Colon cancer Neg Hx   . Esophageal cancer Neg Hx   . Pancreatic cancer Neg Hx   . Prostate cancer Neg Hx   . Rectal cancer Neg Hx   . Stomach cancer Neg Hx     Review of Systems  Constitutional: Negative for chills and fever.  Eyes: Negative for visual disturbance.  Respiratory: Negative for cough, shortness of breath and wheezing.   Cardiovascular: Negative for chest pain, palpitations and leg swelling.  Gastrointestinal: Negative for abdominal pain, blood in stool, constipation, diarrhea and nausea.       No gerd  Genitourinary: Negative for dysuria and hematuria.  Musculoskeletal: Negative for arthralgias.  Skin: Negative for rash.  Neurological: Negative for light-headedness and headaches.        Objective:   Vitals:   06/14/20 0813  BP: 112/72  Pulse: 63  Temp: 98.1 F (36.7 C)  SpO2: 99%   Filed Weights   06/14/20 0813  Weight: 144 lb (65.3 kg)   Body mass index is 21.27 kg/m.  BP Readings from Last 3 Encounters:  06/14/20 112/72  06/20/19 112/74  05/05/19 108/68    Wt Readings from Last 3 Encounters:  06/14/20 144 lb (65.3 kg)  06/20/19 141 lb (64 kg)  05/05/19 146 lb (66.2 kg)     Physical Exam Constitutional: She appears well-developed and well-nourished. No distress.  HENT:  Head: Normocephalic and atraumatic.  Right Ear: External ear normal. Normal ear canal and TM Left Ear: External ear normal.  Normal ear canal and TM Mouth/Throat: Oropharynx is clear and moist.  Eyes: Conjunctivae  normal.  Neck: Neck supple. No tracheal deviation present. No thyromegaly present.  No carotid bruit  Cardiovascular: Normal rate, regular rhythm and normal heart sounds.   No murmur heard.  No edema. Pulmonary/Chest: Effort normal and breath sounds normal. No respiratory distress. She has no wheezes. She has no rales.  Abdominal: Soft. She exhibits no distension. There is no tenderness.  Lymphadenopathy: She has no cervical adenopathy.  Skin: Skin is warm and dry. She is not diaphoretic.  Psychiatric: She has a normal mood and affect. Her behavior is normal.        Assessment & Plan:      See Problem List for Assessment and Plan of chronic medical problems.

## 2020-06-14 ENCOUNTER — Encounter: Payer: Self-pay | Admitting: Internal Medicine

## 2020-06-14 ENCOUNTER — Ambulatory Visit (INDEPENDENT_AMBULATORY_CARE_PROVIDER_SITE_OTHER): Payer: 59 | Admitting: Internal Medicine

## 2020-06-14 ENCOUNTER — Other Ambulatory Visit: Payer: Self-pay

## 2020-06-14 VITALS — BP 112/72 | HR 63 | Temp 98.1°F | Ht 69.0 in | Wt 144.0 lb

## 2020-06-14 DIAGNOSIS — Z01818 Encounter for other preprocedural examination: Secondary | ICD-10-CM | POA: Diagnosis not present

## 2020-06-14 LAB — CBC WITH DIFFERENTIAL/PLATELET
Basophils Absolute: 0 10*3/uL (ref 0.0–0.1)
Basophils Relative: 0.6 % (ref 0.0–3.0)
Eosinophils Absolute: 0.1 10*3/uL (ref 0.0–0.7)
Eosinophils Relative: 1.4 % (ref 0.0–5.0)
HCT: 43.2 % (ref 36.0–46.0)
Hemoglobin: 14.6 g/dL (ref 12.0–15.0)
Lymphocytes Relative: 28 % (ref 12.0–46.0)
Lymphs Abs: 1.4 10*3/uL (ref 0.7–4.0)
MCHC: 33.7 g/dL (ref 30.0–36.0)
MCV: 91.2 fl (ref 78.0–100.0)
Monocytes Absolute: 0.6 10*3/uL (ref 0.1–1.0)
Monocytes Relative: 12.3 % — ABNORMAL HIGH (ref 3.0–12.0)
Neutro Abs: 2.8 10*3/uL (ref 1.4–7.7)
Neutrophils Relative %: 57.7 % (ref 43.0–77.0)
Platelets: 207 10*3/uL (ref 150.0–400.0)
RBC: 4.74 Mil/uL (ref 3.87–5.11)
RDW: 13.4 % (ref 11.5–15.5)
WBC: 4.9 10*3/uL (ref 4.0–10.5)

## 2020-06-14 LAB — COMPREHENSIVE METABOLIC PANEL
ALT: 14 U/L (ref 0–35)
AST: 19 U/L (ref 0–37)
Albumin: 4.4 g/dL (ref 3.5–5.2)
Alkaline Phosphatase: 61 U/L (ref 39–117)
BUN: 22 mg/dL (ref 6–23)
CO2: 30 mEq/L (ref 19–32)
Calcium: 9.8 mg/dL (ref 8.4–10.5)
Chloride: 103 mEq/L (ref 96–112)
Creatinine, Ser: 0.91 mg/dL (ref 0.40–1.20)
GFR: 77.86 mL/min (ref >60.00)
Glucose, Bld: 91 mg/dL (ref 70–99)
Potassium: 4.3 mEq/L (ref 3.5–5.1)
Sodium: 139 mEq/L (ref 135–145)
Total Bilirubin: 0.6 mg/dL (ref 0.2–1.2)
Total Protein: 7.3 g/dL (ref 6.0–8.3)

## 2020-06-14 LAB — HEMOGLOBIN A1C: Hgb A1c MFr Bld: 5.4 % (ref 4.6–6.5)

## 2020-06-14 LAB — PROTIME-INR
INR: 1.2 ratio — ABNORMAL HIGH (ref 0.8–1.0)
Prothrombin Time: 13.1 s (ref 9.6–13.1)

## 2020-06-14 LAB — APTT: aPTT: 29.2 s (ref 23.4–32.7)

## 2020-06-14 NOTE — Assessment & Plan Note (Signed)
Here for preop for breast augmentation under general anesthesia Very healthy on no prescription meds No concerning symptoms, No concerning symptoms suggestive of CAD or lung dz Labs today - cbc, cmp, a1c, PT,PTT,INR   Low risk for low risk surgery - cleared for surgery.

## 2020-07-17 ENCOUNTER — Telehealth: Payer: Self-pay | Admitting: Internal Medicine

## 2020-07-17 NOTE — Telephone Encounter (Signed)
I am sorry to learn of her father's colon cancer dx and hope he does well  Her recall will not change, she had a small adenoma in 2018 and recall for this (now would be 7 years, but then was 5 years) and family history would be 5 years. Repeat colonoscopy recommended Oct 2023  Thanks JMP

## 2020-07-17 NOTE — Telephone Encounter (Signed)
See note below. Pt had last colon in 12/2016 and is due for recall 12/2021. Please advise.

## 2020-07-17 NOTE — Telephone Encounter (Signed)
Pt aware.

## 2020-07-17 NOTE — Telephone Encounter (Signed)
Patient called states her father was just diagnosed with colon cancer and she is wondering if she would need to get her colonsnocpy sooner than her recall date.

## 2020-08-13 ENCOUNTER — Ambulatory Visit: Payer: Self-pay

## 2020-08-13 ENCOUNTER — Encounter: Payer: Self-pay | Admitting: Sports Medicine

## 2020-08-13 ENCOUNTER — Other Ambulatory Visit: Payer: Self-pay

## 2020-08-13 ENCOUNTER — Ambulatory Visit (INDEPENDENT_AMBULATORY_CARE_PROVIDER_SITE_OTHER): Payer: 59 | Admitting: Sports Medicine

## 2020-08-13 VITALS — BP 108/68 | Ht 69.0 in | Wt 141.0 lb

## 2020-08-13 DIAGNOSIS — D1739 Benign lipomatous neoplasm of skin and subcutaneous tissue of other sites: Secondary | ICD-10-CM

## 2020-08-13 DIAGNOSIS — M25552 Pain in left hip: Secondary | ICD-10-CM

## 2020-08-13 NOTE — Patient Instructions (Signed)
Thank you for coming in to see [place] today!  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  Unfortunately, this is not able to be drained, and may require surgery for removal if it continues to be bothersome.     Please call the clinic at [phone] if your symptoms worsen or you have any concerns. It was our pleasure to serve you.       Dr. Dagoberto Ligas Dr. Andreas Blower [facility]

## 2020-08-13 NOTE — Progress Notes (Signed)
   PCP: Binnie Rail, MD  Subjective:   HPI: Patient is a 45 y.o. female here for evaluation of L lateral hip swelling.  She reports that in early January, she was putting away her Christmas decorations, was trying to reach up on a shelf, slipped and fell and hit her hip directly on the ladder or some object.  She had pretty immediate swelling over the lateral aspect of her hip.  Since then, she has been massaging it, and waiting for it to go away, however she has continued to have significant swelling.  She is very recreationally active and does a lot of lifting in the gym and has not noticed any significant weakness in her leg.  It is mildly tender to palpation and may be getting slightly smaller with time but is still swollen.  She is wondering what it could be.  Social Hx: married to [place], [place]  Review of Systems:  Per HPI.   [place], medications and smoking status reviewed.      Objective:  Physical Exam:  [place] Adult Exercise 08/13/2020  Frequency of aerobic exercise (# of days/week) 3  Average time in minutes 60  Frequency of strengthening activities (# of days/week) 3     Gen: awake, alert, NAD, comfortable in exam room Pulm: breathing unlabored  Left lateral hip: -Inspection: There is an approximately 5 cm x 3 cm area of swelling over the lateral hip just distal to the greater trochanter.  There is no ecchymoses, erythema, or warmth. -Palpation: Area of swelling is fairly soft to palpation and compressible.  No significant tenderness over the greater trochanter, she does have some tenderness over the area of swelling just distal to the greater trochanter. -ROM: Full passive and active range of motion of the hip with minimal pain -Strength: 5/5 strength with hip abduction, external rotation, internal rotation with some very mild pain with hip abduction. -Special tests: Negative FABER/FADIR  Limited US examination left lateral hip: -Greater trochanter  was visualized and there are no major bony abnormalities.  Gluteus medius and minimus insertions well visualized and without abnormalities. -Attention was then drawn to the area of swelling just distal to the greater trochanter.  In this area, there is a hypoechoic and compressible section of lipomatous tissue which is significantly more hypoechoic than the surrounding lipomatous tissue.  Impression: -Hypoechoic lipomatous tissue consistent with possible traumatic lipoma in the area of swelling.  Ultrasound performed interpreted by Dagoberto Ligas, MD and Andreas Blower, MD.    [place]:  1.  Traumatic lipoma of left lateral hip Ultrasound findings most consistent with traumatic lipoma.  Differential also includes healing seroma or Sherry Ruffing lesion, however would not expect there to be as much edema within the lipomatous tissue if that were the case.  We discussed that unfortunately this is not amenable to percutaneous drainage, and if it  continues to be bothersome, would recommend discussing with a general surgeon whether removal of the lipoma would be warranted.  Patient voiced understanding.  We will follow-up as needed for this issue.   Dagoberto Ligas, MD [facility] Fellow 08/13/2020 9:48 AM   I observed and examined the patient with the [facility] resident and agree with assessment and plan.  Note reviewed and modified by me. Ila Mcgill, MD

## 2020-09-16 ENCOUNTER — Ambulatory Visit: Payer: Self-pay | Admitting: Surgery

## 2020-09-16 NOTE — H&P (Signed)
REFERRING PHYSICIAN:  Dr. Andreas Blower PCP - Dr. Billey Gosling   PROVIDER:  Carlean Jews, MD   MRN: [id number] DOB: [birth year] DATE OF ENCOUNTER: 09/16/2020   Subjective    Chief Complaint: Mass (Lateral left thigh)       History of Present Illness: Erin Thompson is a 45 y.o. female who is seen today as an office consultation at the request of Dr. Hardin Negus for evaluation of Mass (Lateral left thigh) .     This is a healthy 45 year old female who presents after having an accident at home in January 2022.  She was in the attic and fell through the ceiling.  She hit her left hip on the been in the attic.  She developed significant ecchymosis and a hematoma in this area.  Her husband is a Curator.  He perform some soft tissue work on this area to decrease the size of the hematoma.  Subsequently this area has healed but continues to have a distinct palpable mass.  This causes some discomfort.  She denies any pain radiating down her leg.   She was evaluated recently by Dr. Oneida Alar in sports medicine.  A bedside ultrasound was performed that showed a 5 cm area of hypoechoic lipomatous tissue in the area in question.  This seems to be in the subcutaneous space.  This ultrasound was not formally read by radiologist.  She presents now to discuss excision of this area.   Review of Systems: A complete review of systems was obtained from the patient.  I have reviewed this information and discussed as appropriate with the patient.  See HPI as well for other ROS.   Review of Systems  Constitutional: Negative.   HENT: Negative.   Eyes: Negative.   Respiratory: Negative.   Cardiovascular: Negative.   Gastrointestinal: Negative.   Genitourinary: Negative.   Musculoskeletal: Negative.   Skin: Negative.   Neurological: Negative.   Endo/Heme/Allergies: Negative.   Psychiatric/Behavioral: Negative.         Medical History: Past Medical History  History reviewed. No pertinent past  medical history.     There is no problem list on file for this patient.     Past Surgical History       Past Surgical History:  Procedure Laterality Date   breats augmentation N/A          Allergies  No Known Allergies           Current Outpatient Medications on File Prior to Visit  Medication Sig Dispense Refill   cholecalciferol (VITAMIN D3) 1000 unit tablet Take by mouth       miscellaneous medical supply Misc         multivitamin with minerals tablet Take 1 tablet by mouth once daily        No current facility-administered medications on file prior to visit.      Family History       Family History  Problem Relation Age of Onset   Diabetes Mother     Myocardial Infarction (Heart attack) Mother     Hyperlipidemia (Elevated cholesterol) Mother     High blood pressure (Hypertension) Mother     Colon cancer Father     Diabetes Father     Hyperlipidemia (Elevated cholesterol) Father     High blood pressure (Hypertension) Father          Social History       Tobacco Use  Smoking Status Never Smoker  Smokeless Tobacco Never  Used      Social History  Social History         Socioeconomic History   Marital status: Married  Tobacco Use   Smoking status: Never Smoker   Smokeless tobacco: Never Used  Scientific laboratory technician Use: Never used  Substance and Sexual Activity   Alcohol use: Yes      Alcohol/week: 1.0 standard drink      Types: 1 Glasses of wine per week   Drug use: Never        Objective:         Vitals:    09/16/20 1143  BP: 122/70  Pulse: 77  Temp: 36.9 C (98.4 F)  SpO2: 100%  Weight: 66 kg (145 lb 6.4 oz)  Height: 165.1 cm (5\' 5" )    Body mass index is 24.2 kg/m.   Physical Exam    Constitutional:  WDWN in NAD, conversant, no obvious deformities; lying in bed comfortably Eyes:  Pupils equal, round; sclera anicteric; moist conjunctiva; no lid lag HENT:  Oral mucosa moist; good dentition  Neck:  No masses palpated, trachea  midline; no thyromegaly Lungs:  CTA bilaterally; normal respiratory effort CV:  Regular rate and rhythm; no murmurs; extremities well-perfused with no edema Abd:  +bowel sounds, soft, non-tender, no palpable organomegaly; no palpable hernias Musc:  Left lateral upper thigh - protruding 5 x 4 cm subcutaneous mass with clear demarcation; no skin changes; no drainage. 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  Potential risks, benefits, alternative treatments, and expected outcomes have been explained.  All of the patient's questions at this time have been answered.  The likelihood of reaching the patient's treatment goal is good.  The patient understand the proposed surgical procedure and wishes to proceed.       Carlean Jews, MD  09/16/2020 8:58 PM

## 2020-09-16 NOTE — H&P (View-Only) (Signed)
REFERRING PHYSICIAN:  Dr. Andreas Blower PCP - Dr. Billey Gosling   PROVIDER:  Carlean Jews, MD   MRN: [id number] DOB: 1976/02/09 DATE OF ENCOUNTER: 09/16/2020   Subjective    Chief Complaint: Mass (Lateral left thigh)       History of Present Illness: Erin Thompson is a 45 y.o. female who is seen today as an office consultation at the request of Dr. Hardin Negus for evaluation of Mass (Lateral left thigh) .     This is a healthy 45 year old female who presents after having an accident at home in January 2022.  She was in the attic and fell through the ceiling.  She hit her left hip on the been in the attic.  She developed significant ecchymosis and a hematoma in this area.  Her husband is a Curator.  He perform some soft tissue work on this area to decrease the size of the hematoma.  Subsequently this area has healed but continues to have a distinct palpable mass.  This causes some discomfort.  She denies any pain radiating down her leg.   She was evaluated recently by Dr. Oneida Alar in sports medicine.  A bedside ultrasound was performed that showed a 5 cm area of hypoechoic lipomatous tissue in the area in question.  This seems to be in the subcutaneous space.  This ultrasound was not formally read by radiologist.  She presents now to discuss excision of this area.   Review of Systems: A complete review of systems was obtained from the patient.  I have reviewed this information and discussed as appropriate with the patient.  See HPI as well for other ROS.   Review of Systems  Constitutional: Negative.   HENT: Negative.   Eyes: Negative.   Respiratory: Negative.   Cardiovascular: Negative.   Gastrointestinal: Negative.   Genitourinary: Negative.   Musculoskeletal: Negative.   Skin: Negative.   Neurological: Negative.   Endo/Heme/Allergies: Negative.   Psychiatric/Behavioral: Negative.         Medical History: Past Medical History  History reviewed. No pertinent past  medical history.     There is no problem list on file for this patient.     Past Surgical History       Past Surgical History:  Procedure Laterality Date   breats augmentation N/A          Allergies  No Known Allergies           Current Outpatient Medications on File Prior to Visit  Medication Sig Dispense Refill   cholecalciferol (VITAMIN D3) 1000 unit tablet Take by mouth       miscellaneous medical supply Misc         multivitamin with minerals tablet Take 1 tablet by mouth once daily        No current facility-administered medications on file prior to visit.      Family History       Family History  Problem Relation Age of Onset   Diabetes Mother     Myocardial Infarction (Heart attack) Mother     Hyperlipidemia (Elevated cholesterol) Mother     High blood pressure (Hypertension) Mother     Colon cancer Father     Diabetes Father     Hyperlipidemia (Elevated cholesterol) Father     High blood pressure (Hypertension) Father          Social History       Tobacco Use  Smoking Status Never Smoker  Smokeless Tobacco Never  Used      Social History  Social History         Socioeconomic History   Marital status: Married  Tobacco Use   Smoking status: Never Smoker   Smokeless tobacco: Never Used  Scientific laboratory technician Use: Never used  Substance and Sexual Activity   Alcohol use: Yes      Alcohol/week: 1.0 standard drink      Types: 1 Glasses of wine per week   Drug use: Never        Objective:         Vitals:    09/16/20 1143  BP: 122/70  Pulse: 77  Temp: 36.9 C (98.4 F)  SpO2: 100%  Weight: 66 kg (145 lb 6.4 oz)  Height: 165.1 cm (5\' 5" )    Body mass index is 24.2 kg/m.   Physical Exam    Constitutional:  WDWN in NAD, conversant, no obvious deformities; lying in bed comfortably Eyes:  Pupils equal, round; sclera anicteric; moist conjunctiva; no lid lag HENT:  Oral mucosa moist; good dentition  Neck:  No masses palpated, trachea  midline; no thyromegaly Lungs:  CTA bilaterally; normal respiratory effort CV:  Regular rate and rhythm; no murmurs; extremities well-perfused with no edema Abd:  +bowel sounds, soft, non-tender, no palpable organomegaly; no palpable hernias Musc:  Left lateral upper thigh - protruding 5 x 4 cm subcutaneous mass with clear demarcation; no skin changes; no drainage. 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  Potential risks, benefits, alternative treatments, and expected outcomes have been explained.  All of the patient's questions at this time have been answered.  The likelihood of reaching the patient's treatment goal is good.  The patient understand the proposed surgical procedure and wishes to proceed.       Carlean Jews, MD  09/16/2020 8:58 PM

## 2020-10-01 ENCOUNTER — Other Ambulatory Visit: Payer: Self-pay

## 2020-10-01 ENCOUNTER — Telehealth: Payer: Self-pay | Admitting: Internal Medicine

## 2020-10-01 ENCOUNTER — Ambulatory Visit (INDEPENDENT_AMBULATORY_CARE_PROVIDER_SITE_OTHER): Payer: 59 | Admitting: Family Medicine

## 2020-10-01 ENCOUNTER — Encounter: Payer: Self-pay | Admitting: Family Medicine

## 2020-10-01 VITALS — BP 112/72 | HR 70 | Temp 97.8°F | Wt 145.0 lb

## 2020-10-01 DIAGNOSIS — R1013 Epigastric pain: Secondary | ICD-10-CM

## 2020-10-01 NOTE — Progress Notes (Signed)
Established Patient Office Visit  Subjective:  Patient ID: Erin Thompson, female    DOB: 13-Oct-1975  Age: 45 y.o. MRN: [id number]  CC:  Chief Complaint  Patient presents with   Follow-up    Pt    HPI Erin Thompson presents for evaluation and treatment of a 1 day history of midepigastric pain that moves slightly to the right.  At times it has been a 5-7 on a scale of 10.  There is been no nausea or vomiting but appetite is diminished.  There is been no radiation of pain to the right shoulder.  Stools have been normal and she denies constipation.  Urination is normal without issue.  She tells of extreme fatigue and malaise.  She has no history of reflux and has no reflux symptoms.  No history of abdominal surgery.  She rarely drinks alcohol.  Both parents are status post cholecystectomy.  Dad has a history of pancreatitis.  He does not drink as well.  Patient's lipid profile has been normal.  Triglycerides were 81 1 year ago.  Past Medical History:  Diagnosis Date   Depression    pt denies   Fracture of right wrist    Hemorrhoids    History of chlamydia    Tubular adenoma of colon     Past Surgical History:  Procedure Laterality Date   BREAST SURGERY     Bi-lat Implants   HEMORRHOID BANDING     HERNIA REPAIR     AT BIRTH   WRIST SURGERY      Family History  Problem Relation Age of Onset   Hypertension Mother    Diabetes Mother    Diabetes Maternal Grandmother    Cancer Maternal Grandmother        Thyroid   Throat cancer Maternal Grandmother    Diabetes Father    Hypertension Father    Breast cancer Maternal Aunt    Breast cancer Paternal Aunt    Colon cancer Neg Hx    Esophageal cancer Neg Hx    Pancreatic cancer Neg Hx    Prostate cancer Neg Hx    Rectal cancer Neg Hx    Stomach cancer Neg Hx     Social History   Socioeconomic History   Marital status: Married    Spouse name: Not on file   Number of children: Not on file   Years of education: Not  on file   Highest education level: Not on file  Occupational History   Not on file  Tobacco Use   Smoking status: Never   Smokeless tobacco: Never  Vaping Use   Vaping Use: Never used  Substance and Sexual Activity   Alcohol use: Yes    Alcohol/week: 0.0 standard drinks    Comment: occasionally   Drug use: No   Sexual activity: Yes    Partners: Male    Birth control/protection: None  Other Topics Concern   Not on file  Social History Narrative   Not on file   Social Determinants of Health   Financial Resource Strain: Not on file  Food Insecurity: Not on file  Transportation Needs: Not on file  Physical Activity: Not on file  Stress: Not on file  Social Connections: Not on file  Intimate Partner Violence: Not on file    Outpatient Medications Prior to Visit  Medication Sig Dispense Refill   cholecalciferol (VITAMIN D3) 25 MCG (1000 UNIT) tablet Take 1,000 Units by mouth daily.  ELDERBERRY PO Take by mouth.     Multiple Vitamins-Minerals (MULTIVITAMIN WITH MINERALS) tablet Take 1 tablet by mouth daily.     No facility-administered medications prior to visit.    No Known Allergies  ROS Review of Systems  Constitutional:  Positive for fatigue. Negative for chills, diaphoresis, fever and unexpected weight change.  HENT: Negative.    Eyes:  Negative for photophobia and visual disturbance.  Respiratory:  Negative for cough, chest tightness and wheezing.   Cardiovascular:  Negative for chest pain.  Gastrointestinal:  Positive for abdominal pain. Negative for abdominal distention, anal bleeding, blood in stool, constipation, diarrhea, nausea and vomiting.  Endocrine: Negative for polyphagia and polyuria.  Genitourinary: Negative.  Negative for difficulty urinating, dysuria and frequency.  Neurological:  Negative for speech difficulty and weakness.  Psychiatric/Behavioral: Negative.       Objective:    Physical Exam Vitals and nursing note reviewed.   Constitutional:      General: She is not in acute distress.    Appearance: Normal appearance. She is normal weight. She is not ill-appearing, toxic-appearing or diaphoretic.  HENT:     Head: Normocephalic and atraumatic.     Right Ear: External ear normal.     Left Ear: External ear normal.     Mouth/Throat:     Mouth: Mucous membranes are moist.     Pharynx: Oropharynx is clear. No oropharyngeal exudate or posterior oropharyngeal erythema.  Eyes:     General: No scleral icterus.       Right eye: No discharge.        Left eye: No discharge.     Extraocular Movements: Extraocular movements intact.     Conjunctiva/sclera: Conjunctivae normal.     Pupils: Pupils are equal, round, and reactive to light.  Neck:     Vascular: No carotid bruit.  Cardiovascular:     Rate and Rhythm: Normal rate and regular rhythm.  Pulmonary:     Effort: Pulmonary effort is normal.     Breath sounds: Normal breath sounds.  Abdominal:     General: Bowel sounds are normal. There is no distension.     Palpations: There is no mass.     Tenderness: There is no abdominal tenderness. There is no guarding or rebound.     Hernia: No hernia is present.  Musculoskeletal:     Cervical back: No rigidity or tenderness.     Right lower leg: No edema.     Left lower leg: No edema.  Lymphadenopathy:     Cervical: No cervical adenopathy.  Skin:    General: Skin is warm and dry.  Neurological:     Mental Status: She is alert and oriented to person, place, and time.  Psychiatric:        Mood and Affect: Mood normal.        Behavior: Behavior normal.    BP 112/72   Pulse 70   Temp 97.8 F (36.6 C) (Temporal)   Wt 145 lb (65.8 kg)   SpO2 96%   BMI 21.41 kg/m  Wt Readings from Last 3 Encounters:  10/01/20 145 lb (65.8 kg)  08/13/20 141 lb (64 kg)  06/14/20 144 lb (65.3 kg)     Health Maintenance Due  Topic Date Due   COVID-19 Vaccine (2 - Booster for Janssen series) 08/10/2019   TETANUS/TDAP   01/15/2020   MAMMOGRAM  03/21/2020   PAP SMEAR-Modifier  11/12/2020    There are no preventive care reminders to display  for this patient.  Lab Results  Component Value Date   TSH 1.55 05/05/2019   Lab Results  Component Value Date   WBC 4.9 06/14/2020   HGB 14.6 06/14/2020   HCT 43.2 06/14/2020   MCV 91.2 06/14/2020   PLT 207.0 06/14/2020   Lab Results  Component Value Date   NA 139 06/14/2020   K 4.3 06/14/2020   CO2 30 06/14/2020   GLUCOSE 91 06/14/2020   BUN 22 06/14/2020   CREATININE 0.91 06/14/2020   BILITOT 0.6 06/14/2020   ALKPHOS 61 06/14/2020   AST 19 06/14/2020   ALT 14 06/14/2020   PROT 7.3 06/14/2020   ALBUMIN 4.4 06/14/2020   CALCIUM 9.8 06/14/2020   ANIONGAP 7 08/12/2015   GFR 77.86 06/14/2020   Lab Results  Component Value Date   CHOL 143 05/05/2019   Lab Results  Component Value Date   HDL 52.20 05/05/2019   Lab Results  Component Value Date   LDLCALC 75 05/05/2019   Lab Results  Component Value Date   TRIG 81.0 05/05/2019   Lab Results  Component Value Date   CHOLHDL 3 05/05/2019   Lab Results  Component Value Date   HGBA1C 5.4 06/14/2020      Assessment & Plan:   Problem List Items Addressed This Visit   None Visit Diagnoses     Epigastric pain    -  Primary   Relevant Orders   Amylase   Basic metabolic panel   CBC   Hepatic function panel   Urinalysis, Routine w reflex microscopic   Lipase   US Abdomen Complete       No orders of the defined types were placed in this encounter.   Follow-up: Return in about 2 weeks (around 10/15/2020), or To ER if worse..  Patient does not have the right profile for gallbladder disease but with her strong family history would have to consider that.  Abdominal ultrasound ordered with appropriate blood work.  Advised to go to ER if worse.  Libby Maw, MD

## 2020-10-01 NOTE — Telephone Encounter (Signed)
    Patient calling to report abd pain/ upper rib area got worse overnight. Chills/ ?fever Call transferred to [facility]

## 2020-10-02 ENCOUNTER — Ambulatory Visit (HOSPITAL_BASED_OUTPATIENT_CLINIC_OR_DEPARTMENT_OTHER)
Admission: RE | Admit: 2020-10-02 | Discharge: 2020-10-02 | Disposition: A | Discharge status: Home / Self Care | Payer: 59 | Source: Ambulatory Visit | Attending: Family Medicine | Admitting: Family Medicine

## 2020-10-02 DIAGNOSIS — R1013 Epigastric pain: Secondary | ICD-10-CM | POA: Insufficient documentation

## 2020-10-02 LAB — CBC
HCT: 43.4 % (ref 36.0–46.0)
Hemoglobin: 14.4 g/dL (ref 12.0–15.0)
MCHC: 33.2 g/dL (ref 30.0–36.0)
MCV: 92.3 fl (ref 78.0–100.0)
Platelets: 188 10*3/uL (ref 150.0–400.0)
RBC: 4.7 Mil/uL (ref 3.87–5.11)
RDW: 13.3 % (ref 11.5–15.5)
WBC: 3.5 10*3/uL — ABNORMAL LOW (ref 4.0–10.5)

## 2020-10-02 LAB — URINALYSIS, ROUTINE W REFLEX MICROSCOPIC
Bilirubin Urine: NEGATIVE
Hgb urine dipstick: NEGATIVE
Ketones, ur: NEGATIVE
Nitrite: NEGATIVE
Specific Gravity, Urine: 1.02 (ref 1.000–1.030)
Total Protein, Urine: NEGATIVE
Urine Glucose: NEGATIVE
Urobilinogen, UA: 1 (ref 0.0–1.0)
pH: 6 (ref 5.0–8.0)

## 2020-10-02 LAB — HEPATIC FUNCTION PANEL
ALT: 12 U/L (ref 0–35)
AST: 19 U/L (ref 0–37)
Albumin: 4.5 g/dL (ref 3.5–5.2)
Alkaline Phosphatase: 66 U/L (ref 39–117)
Bilirubin, Direct: 0.2 mg/dL (ref 0.0–0.3)
Total Bilirubin: 1 mg/dL (ref 0.2–1.2)
Total Protein: 6.8 g/dL (ref 6.0–8.3)

## 2020-10-02 LAB — BASIC METABOLIC PANEL
BUN: 13 mg/dL (ref 6–23)
CO2: 29 mEq/L (ref 19–32)
Calcium: 9 mg/dL (ref 8.4–10.5)
Chloride: 104 mEq/L (ref 96–112)
Creatinine, Ser: 0.98 mg/dL (ref 0.40–1.20)
GFR: 71.08 mL/min (ref >60.00)
Glucose, Bld: 104 mg/dL — ABNORMAL HIGH (ref 70–99)
Potassium: 3.7 mEq/L (ref 3.5–5.1)
Sodium: 140 mEq/L (ref 135–145)

## 2020-10-02 LAB — LIPASE: Lipase: 23 U/L (ref 11.0–59.0)

## 2020-10-02 LAB — AMYLASE: Amylase: 44 U/L (ref 27–131)

## 2020-10-03 ENCOUNTER — Telehealth: Payer: Self-pay | Admitting: Internal Medicine

## 2020-10-03 NOTE — Telephone Encounter (Signed)
Spoke with patient who states that after looking at her urinalysis results it looks to her like she has a UTI and she would like antibiotics called in. Patient also states that she had U/S done yesterday and would like results for this. Please advise.

## 2020-10-03 NOTE — Telephone Encounter (Signed)
Pt calling back about lab results, She said it looked like she had a UTI based on mychart results and she requested a call back as soon as possible

## 2020-10-04 NOTE — Telephone Encounter (Signed)
Patient aware of message below.

## 2020-10-08 ENCOUNTER — Other Ambulatory Visit: Payer: Self-pay

## 2020-10-08 ENCOUNTER — Encounter (HOSPITAL_BASED_OUTPATIENT_CLINIC_OR_DEPARTMENT_OTHER): Payer: Self-pay | Admitting: Surgery

## 2020-10-14 NOTE — Progress Notes (Signed)
Surgical soap given with instructions, pt verbalized understanding.  

## 2020-10-15 ENCOUNTER — Other Ambulatory Visit: Payer: Self-pay

## 2020-10-15 ENCOUNTER — Encounter (HOSPITAL_BASED_OUTPATIENT_CLINIC_OR_DEPARTMENT_OTHER): Payer: Self-pay | Admitting: Surgery

## 2020-10-15 ENCOUNTER — Ambulatory Visit (HOSPITAL_BASED_OUTPATIENT_CLINIC_OR_DEPARTMENT_OTHER): Payer: 59 | Admitting: Anesthesiology

## 2020-10-15 ENCOUNTER — Encounter (HOSPITAL_BASED_OUTPATIENT_CLINIC_OR_DEPARTMENT_OTHER)
Admission: RE | Disposition: A | Discharge status: Home / Self Care | Payer: Self-pay | Source: Home / Self Care | Attending: Surgery

## 2020-10-15 ENCOUNTER — Ambulatory Visit (HOSPITAL_BASED_OUTPATIENT_CLINIC_OR_DEPARTMENT_OTHER)
Admission: RE | Admit: 2020-10-15 | Discharge: 2020-10-15 | Disposition: A | Discharge status: Home / Self Care | Payer: 59 | Attending: Surgery | Admitting: Surgery

## 2020-10-15 DIAGNOSIS — D1724 Benign lipomatous neoplasm of skin and subcutaneous tissue of left leg: Secondary | ICD-10-CM | POA: Diagnosis present

## 2020-10-15 DIAGNOSIS — Z8249 Family history of ischemic heart disease and other diseases of the circulatory system: Secondary | ICD-10-CM | POA: Insufficient documentation

## 2020-10-15 DIAGNOSIS — Z8349 Family history of other endocrine, nutritional and metabolic diseases: Secondary | ICD-10-CM | POA: Insufficient documentation

## 2020-10-15 DIAGNOSIS — Z833 Family history of diabetes mellitus: Secondary | ICD-10-CM | POA: Insufficient documentation

## 2020-10-15 DIAGNOSIS — Z800 Family history of malignant neoplasm of digestive organs: Secondary | ICD-10-CM | POA: Diagnosis not present

## 2020-10-15 HISTORY — DX: Benign lipomatous neoplasm, unspecified: D17.9

## 2020-10-15 HISTORY — PX: LIPOMA EXCISION: SHX5283

## 2020-10-15 LAB — POCT PREGNANCY, URINE: Preg Test, Ur: NEGATIVE

## 2020-10-15 SURGERY — EXCISION LIPOMA
Anesthesia: General | Site: Hip | Laterality: Left

## 2020-10-15 MED ORDER — PROPOFOL 10 MG/ML IV BOLUS
INTRAVENOUS | Status: DC | PRN
  Administered 2020-10-15: 150 mg via INTRAVENOUS

## 2020-10-15 MED ORDER — CHLORHEXIDINE GLUCONATE CLOTH 2 % EX PADS: 6.0000 | MEDICATED_PAD | Freq: Once | CUTANEOUS | Status: DC

## 2020-10-15 MED ORDER — AMISULPRIDE (ANTIEMETIC) 5 MG/2ML IV SOLN: 10.0000 mg | Freq: Once | INTRAVENOUS | Status: DC | PRN

## 2020-10-15 MED ORDER — BUPIVACAINE-EPINEPHRINE 0.25% -1:200000 IJ SOLN
INTRAMUSCULAR | Status: DC | PRN
  Administered 2020-10-15: 10 mL

## 2020-10-15 MED ORDER — HYDROCODONE-ACETAMINOPHEN 5-325 MG PO TABS: 1.0000 | ORAL_TABLET | Freq: Four times a day (QID) | ORAL | 0 refills | Status: DC | PRN

## 2020-10-15 MED ORDER — LIDOCAINE HCL (PF) 2 % IJ SOLN
INTRAMUSCULAR | Status: AC
  Filled 2020-10-15: qty 5

## 2020-10-15 MED ORDER — SCOPOLAMINE 1 MG/3DAYS TD PT72
MEDICATED_PATCH | TRANSDERMAL | Status: AC
  Filled 2020-10-15: qty 1

## 2020-10-15 MED ORDER — ONDANSETRON HCL 4 MG/2ML IJ SOLN
INTRAMUSCULAR | Status: DC | PRN
  Administered 2020-10-15: 4 mg via INTRAVENOUS

## 2020-10-15 MED ORDER — FENTANYL CITRATE (PF) 100 MCG/2ML IJ SOLN
INTRAMUSCULAR | Status: DC | PRN
  Administered 2020-10-15 (×2): 50 ug via INTRAVENOUS

## 2020-10-15 MED ORDER — MIDAZOLAM HCL 5 MG/5ML IJ SOLN
INTRAMUSCULAR | Status: DC | PRN
Start: 2020-10-15 — End: 2020-10-15
  Administered 2020-10-15: 2 mg via INTRAVENOUS

## 2020-10-15 MED ORDER — ACETAMINOPHEN 500 MG PO TABS
ORAL_TABLET | ORAL | Status: AC
  Filled 2020-10-15: qty 2

## 2020-10-15 MED ORDER — PHENYLEPHRINE HCL (PRESSORS) 10 MG/ML IV SOLN
INTRAVENOUS | Status: DC | PRN
  Administered 2020-10-15: 80 ug via INTRAVENOUS

## 2020-10-15 MED ORDER — CELECOXIB 200 MG PO CAPS
ORAL_CAPSULE | ORAL | Status: AC
  Filled 2020-10-15: qty 1

## 2020-10-15 MED ORDER — CELECOXIB 200 MG PO CAPS
200.0000 mg | ORAL_CAPSULE | Freq: Once | ORAL | Status: AC
  Administered 2020-10-15: 200 mg via ORAL

## 2020-10-15 MED ORDER — FENTANYL CITRATE (PF) 100 MCG/2ML IJ SOLN
INTRAMUSCULAR | Status: AC
  Filled 2020-10-15: qty 2

## 2020-10-15 MED ORDER — FENTANYL CITRATE (PF) 100 MCG/2ML IJ SOLN: 25.0000 ug | INTRAMUSCULAR | Status: DC | PRN

## 2020-10-15 MED ORDER — LIDOCAINE 2% (20 MG/ML) 5 ML SYRINGE
INTRAMUSCULAR | Status: DC | PRN
Start: 2020-10-15 — End: 2020-10-15
  Administered 2020-10-15: 100 mg via INTRAVENOUS

## 2020-10-15 MED ORDER — DEXAMETHASONE SODIUM PHOSPHATE 4 MG/ML IJ SOLN
INTRAMUSCULAR | Status: DC | PRN
  Administered 2020-10-15: 6 mg via INTRAVENOUS

## 2020-10-15 MED ORDER — DEXAMETHASONE SODIUM PHOSPHATE 10 MG/ML IJ SOLN
INTRAMUSCULAR | Status: AC
  Filled 2020-10-15: qty 1

## 2020-10-15 MED ORDER — 0.9 % SODIUM CHLORIDE (POUR BTL) OPTIME
TOPICAL | Status: DC | PRN
  Administered 2020-10-15: 150 mL

## 2020-10-15 MED ORDER — CEFAZOLIN SODIUM-DEXTROSE 2-4 GM/100ML-% IV SOLN
2.0000 g | INTRAVENOUS | Status: AC
  Administered 2020-10-15: 2 g via INTRAVENOUS

## 2020-10-15 MED ORDER — LACTATED RINGERS IV SOLN: INTRAVENOUS | Status: DC

## 2020-10-15 MED ORDER — PHENYLEPHRINE 40 MCG/ML (10ML) SYRINGE FOR IV PUSH (FOR BLOOD PRESSURE SUPPORT)
PREFILLED_SYRINGE | INTRAVENOUS | Status: AC
  Filled 2020-10-15: qty 10

## 2020-10-15 MED ORDER — SCOPOLAMINE 1 MG/3DAYS TD PT72
1.0000 | MEDICATED_PATCH | TRANSDERMAL | Status: DC
  Administered 2020-10-15: 1.5 mg via TRANSDERMAL

## 2020-10-15 MED ORDER — PROMETHAZINE HCL 25 MG/ML IJ SOLN: 6.2500 mg | INTRAMUSCULAR | Status: DC | PRN

## 2020-10-15 MED ORDER — ONDANSETRON HCL 4 MG/2ML IJ SOLN
INTRAMUSCULAR | Status: AC
  Filled 2020-10-15: qty 2

## 2020-10-15 MED ORDER — ACETAMINOPHEN 500 MG PO TABS
1000.0000 mg | ORAL_TABLET | Freq: Once | ORAL | Status: AC
  Administered 2020-10-15: 1000 mg via ORAL

## 2020-10-15 MED ORDER — MIDAZOLAM HCL 2 MG/2ML IJ SOLN
INTRAMUSCULAR | Status: AC
  Filled 2020-10-15: qty 2

## 2020-10-15 MED ORDER — CEFAZOLIN SODIUM-DEXTROSE 2-4 GM/100ML-% IV SOLN
INTRAVENOUS | Status: AC
  Filled 2020-10-15: qty 100

## 2020-10-15 MED ORDER — ACETAMINOPHEN 500 MG PO TABS: 1000.0000 mg | ORAL_TABLET | ORAL | Status: AC

## 2020-10-15 SURGICAL SUPPLY — 49 items
APL PRP STRL LF DISP 70% ISPRP (MISCELLANEOUS) ×1
APL SKNCLS STERI-STRIP NONHPOA (GAUZE/BANDAGES/DRESSINGS) ×1
BENZOIN TINCTURE PRP APPL 2/3 (GAUZE/BANDAGES/DRESSINGS) ×2 IMPLANT
BLADE CLIPPER SURG (BLADE) IMPLANT
BLADE SURG 15 STRL LF DISP TIS (BLADE) ×1 IMPLANT
BLADE SURG 15 STRL SS (BLADE) ×2
CANISTER SUCT 1200ML W/VALVE (MISCELLANEOUS) ×1 IMPLANT
CHLORAPREP W/TINT 26 (MISCELLANEOUS) ×2 IMPLANT
COVER BACK TABLE 60X90IN (DRAPES) ×2 IMPLANT
COVER MAYO STAND STRL (DRAPES) ×2 IMPLANT
DECANTER SPIKE VIAL GLASS SM (MISCELLANEOUS) IMPLANT
DRAPE LAPAROTOMY 100X72 PEDS (DRAPES) ×2 IMPLANT
DRAPE UTILITY XL STRL (DRAPES) ×2 IMPLANT
DRSG TEGADERM 4X4.75 (GAUZE/BANDAGES/DRESSINGS) ×2 IMPLANT
ELECT COATED BLADE 2.86 ST (ELECTRODE) ×2 IMPLANT
ELECT REM PT RETURN 9FT ADLT (ELECTROSURGICAL) ×2
ELECTRODE REM PT RTRN 9FT ADLT (ELECTROSURGICAL) ×1 IMPLANT
GAUZE SPONGE 4X4 12PLY STRL LF (GAUZE/BANDAGES/DRESSINGS) ×2 IMPLANT
GLOVE SURG ENC MOIS LTX SZ7 (GLOVE) ×2 IMPLANT
GLOVE SURG POLYISO LF SZ6.5 (GLOVE) ×1 IMPLANT
GLOVE SURG POLYISO LF SZ7 (GLOVE) ×1 IMPLANT
GLOVE SURG UNDER POLY LF SZ6.5 (GLOVE) ×2 IMPLANT
GLOVE SURG UNDER POLY LF SZ7 (GLOVE) ×2 IMPLANT
GLOVE SURG UNDER POLY LF SZ7.5 (GLOVE) ×2 IMPLANT
GOWN STRL REUS W/ TWL LRG LVL3 (GOWN DISPOSABLE) ×2 IMPLANT
GOWN STRL REUS W/ TWL XL LVL3 (GOWN DISPOSABLE) IMPLANT
GOWN STRL REUS W/TWL LRG LVL3 (GOWN DISPOSABLE) ×4
GOWN STRL REUS W/TWL XL LVL3 (GOWN DISPOSABLE) ×4
NDL HYPO 25X1 1.5 SAFETY (NEEDLE) ×1 IMPLANT
NEEDLE HYPO 25X1 1.5 SAFETY (NEEDLE) ×2 IMPLANT
NS IRRIG 1000ML POUR BTL (IV SOLUTION) ×1 IMPLANT
PACK BASIN DAY SURGERY FS (CUSTOM PROCEDURE TRAY) ×2 IMPLANT
PENCIL SMOKE EVACUATOR (MISCELLANEOUS) ×2 IMPLANT
SHEET MEDIUM DRAPE 40X70 STRL (DRAPES) IMPLANT
SLEEVE SCD COMPRESS KNEE MED (STOCKING) ×1 IMPLANT
SPONGE GAUZE 2X2 8PLY STRL LF (GAUZE/BANDAGES/DRESSINGS) IMPLANT
SPONGE T-LAP 4X18 ~~LOC~~+RFID (SPONGE) ×2 IMPLANT
STRIP CLOSURE SKIN 1/2X4 (GAUZE/BANDAGES/DRESSINGS) ×2 IMPLANT
SUT MON AB 4-0 PC3 18 (SUTURE) ×1 IMPLANT
SUT PROLENE 6 0 P 1 18 (SUTURE) IMPLANT
SUT SILK 2 0 PERMA HAND 18 BK (SUTURE) IMPLANT
SUT VIC AB 3-0 SH 27 (SUTURE) ×2
SUT VIC AB 3-0 SH 27X BRD (SUTURE) IMPLANT
SUT VICRYL 3-0 CR8 SH (SUTURE) ×1 IMPLANT
SYR BULB EAR ULCER 3OZ GRN STR (SYRINGE) ×2 IMPLANT
SYR CONTROL 10ML LL (SYRINGE) ×2 IMPLANT
TOWEL GREEN STERILE FF (TOWEL DISPOSABLE) ×2 IMPLANT
TUBE CONNECTING 20X1/4 (TUBING) ×1 IMPLANT
YANKAUER SUCT BULB TIP NO VENT (SUCTIONS) ×1 IMPLANT

## 2020-10-15 NOTE — Anesthesia Preprocedure Evaluation (Addendum)
Anesthesia Evaluation  Patient identified by MRN, date of birth, ID band Patient awake    Reviewed: Allergy & Precautions, NPO status , Patient's Chart, lab work & pertinent test results  History of Anesthesia Complications Negative for: history of anesthetic complications  Airway Mallampati: II   Neck ROM: Full    Dental no notable dental hx. (+) Dental Advisory Given   Pulmonary neg pulmonary ROS,    Pulmonary exam normal        Cardiovascular negative cardio ROS Normal cardiovascular exam     Neuro/Psych negative neurological ROS     GI/Hepatic negative GI ROS, Neg liver ROS,   Endo/Other  negative endocrine ROS  Renal/GU negative Renal ROS     Musculoskeletal negative musculoskeletal ROS (+)   Abdominal   Peds  Hematology negative hematology ROS (+)   Anesthesia Other Findings   Reproductive/Obstetrics                            Anesthesia Physical Anesthesia Plan  ASA: 1  Anesthesia Plan: General   Post-op Pain Management:    Induction: Intravenous  PONV Risk Score and Plan: 4 or greater and Ondansetron, Dexamethasone, Midazolam and Scopolamine patch - Pre-op  Airway Management Planned: LMA and Oral ETT  Additional Equipment:   Intra-op Plan:   Post-operative Plan: Extubation in OR  Informed Consent: I have reviewed the patients History and Physical, chart, labs and discussed the procedure including the risks, benefits and alternatives for the proposed anesthesia with the patient or authorized representative who has indicated his/her understanding and acceptance.     Dental advisory given  Plan Discussed with: Anesthesiologist and CRNA  Anesthesia Plan Comments:        Anesthesia Quick Evaluation

## 2020-10-15 NOTE — Anesthesia Postprocedure Evaluation (Signed)
Anesthesia Post Note  Patient: Erin Thompson  Procedure(s) Performed: EXCISION OF SUBCUTANEOUS LIPOMA- LEFT HIP (Left: Hip)     Patient location during evaluation: PACU Anesthesia Type: General Level of consciousness: sedated Pain management: pain level controlled Vital Signs Assessment: post-procedure vital signs reviewed and stable Respiratory status: spontaneous breathing and respiratory function stable Cardiovascular status: stable Postop Assessment: no apparent nausea or vomiting Anesthetic complications: no   No notable events documented.  Last Vitals:  Vitals:   10/15/20 1545 10/15/20 1555  BP: 114/78 121/68  Pulse: (!) 59 (!) 57  Resp: 11 12  Temp:  36.7 C  SpO2: 100% 99%    Last Pain:  Vitals:   10/15/20 1555  TempSrc:   PainSc: 0-No pain                 Daniela Hernan DANIEL

## 2020-10-15 NOTE — Discharge Instructions (Addendum)
[place] Office Phone Number [phone]  Lipoma Excision: POST OP INSTRUCTIONS  Always review your discharge instruction sheet given to you by the facility where your surgery was performed.  IF YOU HAVE DISABILITY OR FAMILY LEAVE FORMS, YOU MUST BRING THEM TO THE OFFICE FOR PROCESSING.  DO NOT GIVE THEM TO YOUR DOCTOR.  A prescription for pain medication may be given to you upon discharge.  Take your pain medication as prescribed, if needed.  If narcotic pain medicine is not needed, then you may take acetaminophen (Tylenol) or ibuprofen (Advil) as needed. No Tylenol or Ibuprofen until 8pm today. Take your usually prescribed medications unless otherwise directed If you need a refill on your pain medication, please contact your pharmacy.  They will contact our office to request authorization.  Prescriptions will not be filled after 5pm or on week-ends. You should eat very light the first 24 hours after surgery, such as soup, crackers, pudding, etc.  Resume your normal diet the day after surgery. Most patients will experience some swelling and bruising around the surgical site.  Ice packs will help.  Swelling and bruising can take several days to resolve.  It is common to experience some constipation if taking pain medication after surgery.  Increasing fluid intake and taking a stool softener will usually help or prevent this problem from occurring.  A mild laxative (Milk of Magnesia or Miralax) should be taken according to package directions if there are no bowel movements after 48 hours. You may remove your bandages 48 hours after surgery, and you may shower at that time.  You will have steri-strips (small skin tapes) in place directly over the incision.  These strips should be left on the skin for 7-10 days.   ACTIVITIES:  You may resume regular daily activities (gradually increasing) beginning the next day.   You may have sexual intercourse when it is comfortable. You may drive  when you no longer are taking prescription pain medication, you can comfortably wear a seatbelt, and you can safely maneuver your car and apply brakes. RETURN TO WORK:  1-2 weeks You should see your doctor in the office for a follow-up appointment approximately two to three weeks after your surgery.    WHEN TO CALL YOUR DOCTOR: Fever over 101.0 Nausea and/or vomiting. Extreme swelling or bruising. Continued bleeding from incision. Increased pain, redness, or drainage from the incision.  The clinic staff is available to answer your questions during regular business hours.  Please don't hesitate to call and ask to speak to one of the nurses for clinical concerns.  If you have a medical emergency, go to the nearest emergency room or call 911.  A surgeon from [facility] is always on call at the hospital.  For further questions, please visit [email or web address]   Post Anesthesia Home Care Instructions  Activity: Get plenty of rest for the remainder of the day. A responsible individual must stay with you for 24 hours following the procedure.  For the next 24 hours, DO NOT: -Drive a car -Paediatric nurse -Drink alcoholic beverages -Take any medication unless instructed by your physician -Make any legal decisions or sign important papers.  Meals: Start with liquid foods such as gelatin or soup. Progress to regular foods as tolerated. Avoid greasy, spicy, heavy foods. If nausea and/or vomiting occur, drink only clear liquids until the nausea and/or vomiting subsides. Call your physician if vomiting continues.  Special Instructions/Symptoms: Your throat may feel dry or sore from the anesthesia or  the breathing tube placed in your throat during surgery. If this causes discomfort, gargle with warm salt water. The discomfort should disappear within 24 hours.  If you had a scopolamine patch placed behind your ear for the management of post- operative nausea and/or  vomiting:  1. The medication in the patch is effective for 72 hours, after which it should be removed.  Wrap patch in a tissue and discard in the trash. Wash hands thoroughly with soap and water. 2. You may remove the patch earlier than 72 hours if you experience unpleasant side effects which may include dry mouth, dizziness or visual disturbances. 3. Avoid touching the patch. Wash your hands with soap and water after contact with the patch.

## 2020-10-15 NOTE — Interval H&P Note (Signed)
History and Physical Interval Note:  10/15/2020 2:02 PM  Erin Thompson  has presented today for surgery, with the diagnosis of SUBCUTANEOUS LIPOMA LEFT HIP.  The various methods of treatment have been discussed with the patient and family. After consideration of risks, benefits and other options for treatment, the patient has consented to  Procedure(s): EXCISION OF SUBCUTANEOUS LIPOMA- LEFT HIP (Left) as a surgical intervention.  The patient's history has been reviewed, patient examined, no change in status, stable for surgery.  I have reviewed the patient's chart and labs.  Questions were answered to the patient's satisfaction.     Maia Petties

## 2020-10-15 NOTE — Transfer of Care (Signed)
Immediate Anesthesia Transfer of Care Note  Patient: Erin Thompson  Procedure(s) Performed: EXCISION OF SUBCUTANEOUS LIPOMA- LEFT HIP (Left: Hip)  Patient Location: PACU  Anesthesia Type:General  Level of Consciousness: awake  Airway & Oxygen Therapy: Patient Spontanous Breathing and Patient connected to face mask oxygen  Post-op Assessment: Report given to RN and Post -op Vital signs reviewed and stable  Post vital signs: Reviewed and stable  Last Vitals:  Vitals Value Taken Time  BP    Temp    Pulse 90 10/15/20 1525  Resp    SpO2 100 % 10/15/20 1525  Vitals shown include unvalidated device data.  Last Pain:  Vitals:   10/15/20 1348  TempSrc: Oral  PainSc: 0-No pain         Complications: No notable events documented.

## 2020-10-15 NOTE — Op Note (Addendum)
Preop diagnosis: Subcutaneous lipoma left lateral thigh Postop diagnosis: Same Procedure performed: Excision of subcutaneous lipoma left lateral thigh (5 cm) Surgeon:Anetria Harwick K Taressa Rauh Resident Assistant:  Dr. Zacarias Pontes I personally performed the key and critical portions of this procedure as documented in my operative note.  Anesthesia: General Indications:This is a healthy 45 year old female who presents after having an accident at home in January 2022.  She was in the attic and fell through the ceiling.  She hit her left hip on the been in the attic.  She developed significant ecchymosis and a hematoma in this area.  Her husband is a Curator.  He perform some soft tissue work on this area to decrease the size of the hematoma.  Subsequently this area has healed but continues to have a distinct palpable mass.  This causes some discomfort.  She denies any pain radiating down her leg.   She was evaluated recently by Dr. Oneida Alar in sports medicine.  A bedside ultrasound was performed that showed a 5 cm area of hypoechoic lipomatous tissue in the area in question.  This seems to be in the subcutaneous space.  This ultrasound was not formally read by radiologist.  She presents now to discuss excision of this area  Description of procedure: The patient is brought to the operating room and placed in the supine position on the operating room table.  After an adequate level of general anesthesia was obtained, she was positioned bumped up slightly on the left side.  Her left lateral thigh was exposed.  This area was prepped with ChloraPrep and draped sterile fashion.  She has a palpable 5 cm area of indistinct firmness consistent with the described lipoma.  We made a 3.5 cm incision over the center of this mass oriented in a longitudinal fashion.  Dissection was carried down through the dermis and superficial subcutaneous tissue until we encountered the fibrotic capsule of the mass.  This mass appears  atypical from a usual lipoma and that it is fairly scarred to the surrounding tissue.  However there is clearly a distinct difference in the appearance of the adipose tissue within the mass in the subcutaneous overlying tissue.  We excised this entire area measuring about 5 cm in diameter.  It is adherent to the underlying fascia.  We dissected this mass off of the underlying fascia.  The entire specimen was removed and sent for pathologic examination.  We irrigated the wound thoroughly and inspected for hemostasis.  The wound was closed with multiple interrupted subcutaneous 3-0 Vicryl sutures.  4-0 Monocryl was used to close the skin.  Benzoin and Steri-Strips were applied.  A clean dressing was applied.  She was extubated and brought to the recovery room in stable condition.  All sponge, instrument, and needle counts are correct.  Erin Thompson. Erin Dover, MD, [facility]  General Surgery   10/15/2020 3:27 PM

## 2020-10-15 NOTE — Anesthesia Procedure Notes (Signed)
Procedure Name: LMA Insertion Date/Time: 10/15/2020 2:11 PM Performed by: Ezequiel Kayser, CRNA Pre-anesthesia Checklist: Patient identified, Emergency Drugs available, Suction available and Patient being monitored Patient Re-evaluated:Patient Re-evaluated prior to induction Oxygen Delivery Method: Circle System Utilized Preoxygenation: Pre-oxygenation with 100% oxygen Induction Type: IV induction Ventilation: Mask ventilation without difficulty LMA: LMA inserted LMA Size: 4.0 Number of attempts: 1 Airway Equipment and Method: Bite block Placement Confirmation: positive ETCO2 Tube secured with: Tape Dental Injury: Teeth and Oropharynx as per pre-operative assessment

## 2020-10-16 ENCOUNTER — Encounter (HOSPITAL_BASED_OUTPATIENT_CLINIC_OR_DEPARTMENT_OTHER): Payer: Self-pay | Admitting: Surgery

## 2020-10-17 LAB — SURGICAL PATHOLOGY

## 2021-03-23 DIAGNOSIS — R509 Fever, unspecified: Secondary | ICD-10-CM | POA: Diagnosis not present

## 2021-03-26 NOTE — Progress Notes (Signed)
Subjective:    Patient ID: Erin Thompson, female    DOB: 1975/07/06, 46 y.o.   MRN: [id number]  This visit occurred during the SARS-CoV-2 public health emergency.  Safety protocols were in place, including screening questions prior to the visit, additional usage of staff PPE, and extensive cleaning of exam room while observing appropriate contact time as indicated for disinfecting solutions.    HPI The patient is here for an acute visit.   She is experiencing depression.  It started about 6 months.  She is tired all the time.  Does not enjoy anything  - just wants to go to bed. States less joy overall, does not enjoy going to the gym, she is not motivated.  She has no energy.  She states decreased appetite.       Denies anxiety.  She does not like medication but feels she needs something to help.   Medications and allergies reviewed with patient and updated if appropriate.  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     Multiple Vitamins-Minerals (MULTIVITAMIN WITH MINERALS) tablet Take 1 tablet by mouth daily.     No current facility-administered medications on file prior to visit.    Past Medical History:  Diagnosis Date   Fracture of right wrist    Hemorrhoids    History of chlamydia    Lipoma    left hip   Tubular adenoma of colon     Past Surgical History:  Procedure Laterality Date   BREAST SURGERY     Bi-lat Implants   HEMORRHOID BANDING     HERNIA REPAIR     AT BIRTH   LIPOMA EXCISION Left 10/15/2020   Procedure: EXCISION OF SUBCUTANEOUS LIPOMA- LEFT HIP;  Surgeon: Donnie Mesa,  MD;  Location: [place];  Service: General;  Laterality: Left;   WRIST SURGERY      Social History   Socioeconomic History   Marital status: Married    Spouse name: Not on file   Number of children: Not on file   Years of education: Not on file   Highest education level: Not on file  Occupational History   Not on file  Tobacco Use   Smoking status: Never   Smokeless tobacco: Never  Vaping Use   Vaping Use: Never used  Substance and Sexual Activity   Alcohol use: Yes    Alcohol/week: 0.0 standard drinks    Comment: occasionally   Drug use: No   Sexual activity: Yes    Partners: Male    Birth control/protection: None    Comment: husband has had vasectomy  Other Topics Concern   Not on file  Social History Narrative   Not on file   Social Determinants of Health   Financial Resource Strain: Not on file  Food Insecurity: Not on file  Transportation Needs: Not on file  Physical Activity: Not on file  Stress: Not on file  Social Connections: Not on file    Family History  Problem Relation Age of Onset   Hypertension Mother    Diabetes Mother    Diabetes  Maternal Grandmother    Cancer Maternal Grandmother        Thyroid   Throat cancer Maternal Grandmother    Diabetes Father    Hypertension Father    Breast cancer Maternal Aunt    Breast cancer Paternal Aunt    Colon cancer Neg Hx    Esophageal cancer Neg Hx    Pancreatic cancer Neg Hx    Prostate cancer Neg Hx    Rectal cancer Neg Hx    Stomach cancer Neg Hx     Review of Systems  Constitutional:  Positive for appetite change (decreased). Negative for fever.  Psychiatric/Behavioral:  Positive for decreased concentration and dysphoric mood. Negative for suicidal ideas. The patient is not nervous/anxious.       Objective:   Vitals:   03/27/21 1022  BP: 110/72  Pulse: 70  Temp: 98.1 F (36.7 C)  SpO2: 97%   BP Readings from Last 3 Encounters:  03/27/21 110/72  10/15/20 121/68   10/01/20 112/72   Wt Readings from Last 3 Encounters:  03/27/21 147 lb (66.7 kg)  10/15/20 142 lb 3.2 oz (64.5 kg)  10/01/20 145 lb (65.8 kg)   Body mass index is 21.71 kg/m.   Physical Exam Constitutional:      General: She is not in acute distress.    Appearance: Normal appearance. She is not ill-appearing.  HENT:     Head: Normocephalic and atraumatic.  Skin:    General: Skin is warm and dry.  Neurological:     Mental Status: She is alert.  Psychiatric:        Behavior: Behavior normal.        Thought Content: Thought content normal.        Judgment: Judgment normal.     Comments: Mild depressed mood           Assessment & Plan:    See Problem List for Assessment and Plan of chronic medical problems.

## 2021-03-27 ENCOUNTER — Other Ambulatory Visit: Payer: Self-pay

## 2021-03-27 ENCOUNTER — Encounter: Payer: Self-pay | Admitting: Internal Medicine

## 2021-03-27 ENCOUNTER — Ambulatory Visit (INDEPENDENT_AMBULATORY_CARE_PROVIDER_SITE_OTHER): Payer: BC Managed Care – PPO | Admitting: Internal Medicine

## 2021-03-27 DIAGNOSIS — F3289 Other specified depressive episodes: Secondary | ICD-10-CM

## 2021-03-27 MED ORDER — ESCITALOPRAM OXALATE 10 MG PO TABS: 10.0000 mg | ORAL_TABLET | Freq: Every day | ORAL | 5 refills | Status: DC

## 2021-03-27 NOTE — Patient Instructions (Addendum)
° ° °  Medications changes include :   lexapro 10 mg daily  Your prescription(s) have been submitted to your pharmacy. Please take as directed and contact our office if you believe you are having problem(s) with the medication(s).     Please followup in 4-6 weeks   Some therapists:  [facility] [place]  Dr Mel Almond - [phone] Dr Terrance Mass   [phone] Dr Ernestene Mention   [phone]  Sandford Craze - clinical social worker/therapist -  [phone]  [facility] [place]  [address] [phone]  [facility] [place] [phone] Office  [facility]            [place]

## 2021-03-27 NOTE — Assessment & Plan Note (Addendum)
Acute H/o depression and anxiety in the past - now with depression x 6 months With dec appetite, anhedonia, dec concentration, no motivation, depression Has been on several medications in the past Discussed options Will start lexapro 10 mg daily F/u in 4-6 weeks Can add wellbutrin if needed.  If better can try 5 mg of lexapro Advised seeing a therapist - names/ numbers given

## 2021-04-01 DIAGNOSIS — J020 Streptococcal pharyngitis: Secondary | ICD-10-CM | POA: Diagnosis not present

## 2021-04-24 DIAGNOSIS — J069 Acute upper respiratory infection, unspecified: Secondary | ICD-10-CM | POA: Diagnosis not present

## 2021-04-24 DIAGNOSIS — J029 Acute pharyngitis, unspecified: Secondary | ICD-10-CM | POA: Diagnosis not present

## 2021-05-13 ENCOUNTER — Ambulatory Visit: Payer: BC Managed Care – PPO | Admitting: Internal Medicine

## 2021-05-16 ENCOUNTER — Other Ambulatory Visit: Payer: Self-pay | Admitting: Internal Medicine

## 2021-06-06 DIAGNOSIS — S0011XA Contusion of right eyelid and periocular area, initial encounter: Secondary | ICD-10-CM | POA: Diagnosis not present

## 2021-07-22 DIAGNOSIS — N390 Urinary tract infection, site not specified: Secondary | ICD-10-CM | POA: Diagnosis not present

## 2021-09-24 ENCOUNTER — Other Ambulatory Visit: Payer: Self-pay | Admitting: Obstetrics and Gynecology

## 2021-09-24 DIAGNOSIS — Z1231 Encounter for screening mammogram for malignant neoplasm of breast: Secondary | ICD-10-CM

## 2021-09-25 ENCOUNTER — Inpatient Hospital Stay: Admission: RE | Admit: 2021-09-25 | Payer: BC Managed Care – PPO | Source: Ambulatory Visit

## 2021-09-26 ENCOUNTER — Other Ambulatory Visit: Payer: Self-pay | Admitting: Obstetrics and Gynecology

## 2021-09-26 ENCOUNTER — Other Ambulatory Visit (HOSPITAL_BASED_OUTPATIENT_CLINIC_OR_DEPARTMENT_OTHER): Payer: Self-pay | Admitting: Internal Medicine

## 2021-09-26 DIAGNOSIS — Z1231 Encounter for screening mammogram for malignant neoplasm of breast: Secondary | ICD-10-CM

## 2021-10-03 ENCOUNTER — Ambulatory Visit (HOSPITAL_BASED_OUTPATIENT_CLINIC_OR_DEPARTMENT_OTHER): Payer: BC Managed Care – PPO | Admitting: Radiology

## 2021-10-03 ENCOUNTER — Encounter: Payer: Self-pay | Admitting: Internal Medicine

## 2021-10-04 DIAGNOSIS — Z1231 Encounter for screening mammogram for malignant neoplasm of breast: Secondary | ICD-10-CM | POA: Diagnosis not present

## 2021-12-08 DIAGNOSIS — R350 Frequency of micturition: Secondary | ICD-10-CM | POA: Diagnosis not present

## 2021-12-12 ENCOUNTER — Ambulatory Visit (AMBULATORY_SURGERY_CENTER): Payer: Self-pay

## 2021-12-12 VITALS — Ht 69.0 in | Wt 148.0 lb

## 2021-12-12 DIAGNOSIS — Z86010 Personal history of colonic polyps: Secondary | ICD-10-CM

## 2021-12-12 MED ORDER — NA SULFATE-K SULFATE-MG SULF 17.5-3.13-1.6 GM/177ML PO SOLN: 1.0000 | ORAL | 0 refills | Status: DC

## 2021-12-12 NOTE — Progress Notes (Signed)
No egg or soy allergy known to patient  No issues known to pt with past sedation with any surgeries or procedures Patient denies ever being told they had issues or difficulty with intubation  No FH of Malignant Hyperthermia Pt is not on diet pills Pt is not on  home 02  Pt is not on blood thinners  Pt denies issues with constipation  No A fib or A flutter Have any cardiac testing pending--denied Pt instructed to use Singlecare.com or GoodRx for a price reduction on prep   

## 2021-12-26 ENCOUNTER — Encounter: Payer: Self-pay | Admitting: Internal Medicine

## 2021-12-30 DIAGNOSIS — Z0000 Encounter for general adult medical examination without abnormal findings: Secondary | ICD-10-CM | POA: Diagnosis not present

## 2022-01-02 ENCOUNTER — Ambulatory Visit (AMBULATORY_SURGERY_CENTER): Payer: BC Managed Care – PPO | Admitting: Internal Medicine

## 2022-01-02 ENCOUNTER — Encounter: Payer: Self-pay | Admitting: Internal Medicine

## 2022-01-02 VITALS — BP 102/53 | HR 66 | Temp 97.5°F | Resp 11 | Ht 69.0 in | Wt 148.0 lb

## 2022-01-02 DIAGNOSIS — Z09 Encounter for follow-up examination after completed treatment for conditions other than malignant neoplasm: Secondary | ICD-10-CM

## 2022-01-02 DIAGNOSIS — Z86010 Personal history of colonic polyps: Secondary | ICD-10-CM

## 2022-01-02 DIAGNOSIS — Z1211 Encounter for screening for malignant neoplasm of colon: Secondary | ICD-10-CM | POA: Diagnosis not present

## 2022-01-02 MED ORDER — SODIUM CHLORIDE 0.9 % IV SOLN: 500.0000 mL | INTRAVENOUS | Status: DC

## 2022-01-02 NOTE — Patient Instructions (Signed)
YOU HAD AN ENDOSCOPIC PROCEDURE TODAY AT THE [facility]:   Refer to the procedure report that was given to you for any specific questions about what was found during the examination.  If the procedure report does not answer your questions, please call your gastroenterologist to clarify.  If you requested that your care partner not be given the details of your procedure findings, then the procedure report has been included in a sealed envelope for you to review at your convenience later.  YOU SHOULD EXPECT: Some feelings of bloating in the abdomen. Passage of more gas than usual.  Walking can help get rid of the air that was put into your GI tract during the procedure and reduce the bloating. If you had a lower endoscopy (such as a colonoscopy or flexible sigmoidoscopy) you may notice spotting of blood in your stool or on the toilet paper. If you underwent a bowel prep for your procedure, you may not have a normal bowel movement for a few days.  Please Note:  You might notice some irritation and congestion in your nose or some drainage.  This is from the oxygen used during your procedure.  There is no need for concern and it should clear up in a day or so.  SYMPTOMS TO REPORT IMMEDIATELY:  Following lower endoscopy (colonoscopy or flexible sigmoidoscopy):  Excessive amounts of blood in the stool  Significant tenderness or worsening of abdominal pains  Swelling of the abdomen that is new, acute  Fever of 100F or higher  For urgent or emergent issues, a gastroenterologist can be reached at any hour by calling [phone]. Do not use MyChart messaging for urgent concerns.    DIET:  We do recommend a small meal at first, but then you may proceed to your regular diet.  Drink plenty of fluids but you should avoid alcoholic beverages for 24 hours.  ACTIVITY:  You should plan to take it easy for the rest of today and you should NOT DRIVE or use heavy machinery until tomorrow (because of  the sedation medicines used during the test).    FOLLOW UP: Our staff will call the number listed on your records the next business day following your procedure.  We will call around 7:15- 8:00 am to check on you and address any questions or concerns that you may have regarding the information given to you following your procedure. If we do not reach you, we will leave a message.      SIGNATURES/CONFIDENTIALITY: You and/or your care partner have signed paperwork which will be entered into your electronic medical record.  These signatures attest to the fact that that the information above on your After Visit Summary has been reviewed and is understood.  Full responsibility of the confidentiality of this discharge information lies with you and/or your care-partner. 

## 2022-01-02 NOTE — Progress Notes (Signed)
GASTROENTEROLOGY PROCEDURE H&P NOTE   Primary Care Physician: Binnie Rail, MD    Reason for Procedure:  History of adenoma  Plan:    Colonoscopy  Patient is appropriate for endoscopic procedure(s) in the ambulatory ([place]) setting.  The nature of the procedure, as well as the risks, benefits, and alternatives were carefully and thoroughly reviewed with the patient. Ample time for discussion and questions allowed. The patient understood, was satisfied, and agreed to proceed.     HPI: Erin Thompson is a 46 y.o. female who presents for colonoscopy.  Medical history as below.  Tolerated the prep.  No recent chest pain or shortness of breath.  No abdominal pain today.  Past Medical History:  Diagnosis Date   Fracture of right wrist    Hemorrhoids    History of chlamydia    Lipoma    left hip   Tubular adenoma of colon     Past Surgical History:  Procedure Laterality Date   BREAST SURGERY     Bi-lat Implants   HEMORRHOID BANDING     HERNIA REPAIR     AT BIRTH   LIPOMA EXCISION Left 10/15/2020   Procedure: EXCISION OF SUBCUTANEOUS LIPOMA- LEFT HIP;  Surgeon: Donnie Mesa, MD;  Location: [place];  Service: General;  Laterality: Left;   WRIST SURGERY      Prior to Admission medications   Medication Sig Start Date End Date Taking? Authorizing Provider  ELDERBERRY PO Take by mouth.   Yes [provider]  Multiple Vitamins-Minerals (MULTIVITAMIN WITH MINERALS) tablet Take 1 tablet by mouth daily.    [provider]    Current Outpatient Medications  Medication Sig Dispense Refill   ELDERBERRY PO Take by mouth.     Multiple Vitamins-Minerals (MULTIVITAMIN WITH MINERALS) tablet Take 1 tablet by mouth daily.     Current Facility-Administered Medications  Medication Dose Route Frequency Provider Last Rate Last Admin   0.9 %  sodium chloride infusion  500 mL Intravenous Continuous Aashrith Eves, Lajuan Lines, MD        Allergies as of  01/02/2022   (No Known Allergies)    Family History  Problem Relation Age of Onset   Hypertension Mother    Diabetes Mother    Colon polyps Father    Colon cancer Father 1   Diabetes Father    Hypertension Father    Colon polyps Brother    Breast cancer Maternal Aunt    Breast cancer Paternal Aunt    Diabetes Maternal Grandmother    Cancer Maternal Grandmother        Thyroid   Throat cancer Maternal Grandmother    Esophageal cancer Neg Hx    Pancreatic cancer Neg Hx    Prostate cancer Neg Hx    Rectal cancer Neg Hx    Stomach cancer Neg Hx     Social History   Socioeconomic History   Marital status: Married    Spouse name: Not on file   Number of children: Not on file   Years of education: Not on file   Highest education level: Not on file  Occupational History   Not on file  Tobacco Use   Smoking status: Never   Smokeless tobacco: Never  Vaping Use   Vaping Use: Never used  Substance and Sexual Activity   Alcohol use: Yes    Alcohol/week: 0.0 standard drinks of alcohol    Comment: occasionally   Drug use: No   Sexual activity:  Yes    Partners: Male    Birth control/protection: None    Comment: husband has had vasectomy  Other Topics Concern   Not on file  Social History Narrative   Not on file   Social Determinants of Health   Financial Resource Strain: Low Risk  (05/24/2018)   Overall Financial Resource Strain (CARDIA)    Difficulty of Paying Living Expenses: Not hard at all  Food Insecurity: No Food Insecurity (05/24/2018)   Hunger Vital Sign    Worried About Running Out of Food in the Last Year: Never true    Cope in the Last Year: Never true  Transportation Needs: Unknown (05/24/2018)   PRAPARE - Hydrologist (Medical): No    Lack of Transportation (Non-Medical): Not on file  Physical Activity: Inactive (06/01/2018)   Exercise Vital Sign    Days of Exercise per Week: 0 days    Minutes of Exercise per  Session: 0 min  Stress: No Stress Concern Present (05/24/2018)   [place]    Feeling of Stress : Not at all  Social Connections: Somewhat Isolated (06/01/2018)   Social Connection and Isolation Panel [NHANES]    Frequency of Communication with Friends and Family: Once a week    Frequency of Social Gatherings with Friends and Family: Once a week    Attends Religious Services: More than 4 times per year    Active Member of [employer] or Organizations: No    Attends Archivist Meetings: Never    Marital Status: Married  Human resources officer Violence: Not At Risk (05/24/2018)   Humiliation, Afraid, Rape, and Kick questionnaire    Fear of Current or Ex-Partner: No    Emotionally Abused: No    Physically Abused: No    Sexually Abused: No    Physical Exam: Vital signs in last 24 hours: '@BP'$  129/75   Pulse 62   Temp (!) 97.5 F (36.4 C)   Resp 15   Ht '5\' 9"'$  (1.753 m)   Wt 148 lb (67.1 kg)   LMP 12/10/2021   SpO2 100%   BMI 21.86 kg/m  GEN: NAD EYE: Sclerae anicteric ENT: MMM CV: Non-tachycardic Pulm: CTA b/l GI: Soft, NT/ND NEURO:  Alert & Oriented x 3   Zenovia Jarred, MD [facility]  01/02/2022 8:07 AM

## 2022-01-02 NOTE — Progress Notes (Signed)
Report to PACU, RN, vss, BBS= Clear.  

## 2022-01-02 NOTE — Progress Notes (Signed)
Pt's states no medical or surgical changes since previsit or office visit. 

## 2022-01-02 NOTE — Op Note (Addendum)
[facility] Patient Name: Erin Thompson Procedure Date: 01/02/2022 7:53 AM MRN: [id number] Endoscopist: Jerene Bears , MD, [id number] Age: 46 Referring MD:  Date of Birth: [birth year] Gender: Female Account #: [id number] Procedure:                Colonoscopy Indications:              High risk colon cancer surveillance: Personal                            history of non-advanced adenoma, Last colonoscopy:                            October 2018; FH of colon cancer in father Medicines:                Monitored Anesthesia Care Procedure:                Pre-Anesthesia Assessment:                           - Prior to the procedure, a History and Physical                            was performed, and patient medications and                            allergies were reviewed. The patient's tolerance of                            previous anesthesia was also reviewed. The risks                            and benefits of the procedure and the sedation                            options and risks were discussed with the patient.                            All questions were answered, and informed consent                            was obtained. Prior Anticoagulants: The patient has                            taken no anticoagulant or antiplatelet agents. ASA                            Grade Assessment: I - A normal, healthy patient.                            After reviewing the risks and benefits, the patient                            was deemed in satisfactory condition to undergo the  procedure.                           After obtaining informed consent, the colonoscope                            was passed under direct vision. Throughout the                            procedure, the patient's blood pressure, pulse, and                            oxygen saturations were monitored continuously. The                            PCF-HQ190L Colonoscope was  introduced through the                            anus and advanced to the cecum, identified by                            appendiceal orifice and ileocecal valve. The                            colonoscopy was performed without difficulty. The                            patient tolerated the procedure well. The quality                            of the bowel preparation was excellent. The                            ileocecal valve, appendiceal orifice, and rectum                            were photographed. Scope In: 8:12:54 AM Scope Out: 8:27:51 AM Scope Withdrawal Time: 0 hours 10 minutes 3 seconds  Total Procedure Duration: 0 hours 14 minutes 57 seconds  Findings:                 The digital rectal exam was normal.                           Post hemorrhoidal banding scars found in the distal                            rectum.                           The exam was otherwise without abnormality on                            direct and retroflexion views. Complications:            No immediate complications. Estimated Blood Loss:     Estimated blood  loss: none. Impression:               - Post-hemorrhoidal banding scarring in the distal                            rectum.                           - The examination was otherwise normal on direct                            and retroflexion views.                           - No specimens collected. Recommendation:           - Patient has a contact number available for                            emergencies. The signs and symptoms of potential                            delayed complications were discussed with the                            patient. Return to normal activities tomorrow.                            Written discharge instructions were provided to the                            patient.                           - Resume previous diet.                           - Continue present medications.                           -  Await pathology results.                           - Repeat colonoscopy in 5 years for surveillance                            (based on family history). Jerene Bears, MD 01/02/2022 8:31:45 AM This report has been signed electronically.

## 2022-01-05 ENCOUNTER — Telehealth: Payer: Self-pay

## 2022-01-05 NOTE — Telephone Encounter (Signed)
Left message on follow up call. 

## 2022-01-08 DIAGNOSIS — Z6822 Body mass index (BMI) 22.0-22.9, adult: Secondary | ICD-10-CM | POA: Diagnosis not present

## 2022-01-08 DIAGNOSIS — R6882 Decreased libido: Secondary | ICD-10-CM | POA: Diagnosis not present

## 2022-01-24 ENCOUNTER — Emergency Department (HOSPITAL_BASED_OUTPATIENT_CLINIC_OR_DEPARTMENT_OTHER): Payer: BC Managed Care – PPO

## 2022-01-24 ENCOUNTER — Encounter (HOSPITAL_BASED_OUTPATIENT_CLINIC_OR_DEPARTMENT_OTHER): Payer: Self-pay | Admitting: Emergency Medicine

## 2022-01-24 ENCOUNTER — Other Ambulatory Visit: Payer: Self-pay

## 2022-01-24 ENCOUNTER — Emergency Department (HOSPITAL_BASED_OUTPATIENT_CLINIC_OR_DEPARTMENT_OTHER)
Admission: EM | Admit: 2022-01-24 | Discharge: 2022-01-24 | Disposition: A | Discharge status: Home / Self Care | Payer: BC Managed Care – PPO | Attending: Emergency Medicine | Admitting: Emergency Medicine

## 2022-01-24 DIAGNOSIS — R197 Diarrhea, unspecified: Secondary | ICD-10-CM | POA: Diagnosis not present

## 2022-01-24 DIAGNOSIS — R1031 Right lower quadrant pain: Secondary | ICD-10-CM | POA: Diagnosis not present

## 2022-01-24 DIAGNOSIS — R110 Nausea: Secondary | ICD-10-CM | POA: Insufficient documentation

## 2022-01-24 DIAGNOSIS — R1013 Epigastric pain: Secondary | ICD-10-CM

## 2022-01-24 DIAGNOSIS — R1011 Right upper quadrant pain: Secondary | ICD-10-CM | POA: Diagnosis not present

## 2022-01-24 DIAGNOSIS — K760 Fatty (change of) liver, not elsewhere classified: Secondary | ICD-10-CM | POA: Diagnosis not present

## 2022-01-24 LAB — CBC WITH DIFFERENTIAL/PLATELET
Abs Immature Granulocytes: 0.02 10*3/uL (ref 0.00–0.07)
Basophils Absolute: 0 10*3/uL (ref 0.0–0.1)
Basophils Relative: 0 %
Eosinophils Absolute: 0.1 10*3/uL (ref 0.0–0.5)
Eosinophils Relative: 1 %
HCT: 44 % (ref 36.0–46.0)
Hemoglobin: 14.8 g/dL (ref 12.0–15.0)
Immature Granulocytes: 0 %
Lymphocytes Relative: 6 %
Lymphs Abs: 0.5 10*3/uL — ABNORMAL LOW (ref 0.7–4.0)
MCH: 30.3 pg (ref 26.0–34.0)
MCHC: 33.6 g/dL (ref 30.0–36.0)
MCV: 90 fL (ref 80.0–100.0)
Monocytes Absolute: 0.3 10*3/uL (ref 0.1–1.0)
Monocytes Relative: 4 %
Neutro Abs: 7.5 10*3/uL (ref 1.7–7.7)
Neutrophils Relative %: 89 %
Platelets: 208 10*3/uL (ref 150–400)
RBC: 4.89 MIL/uL (ref 3.87–5.11)
RDW: 13 % (ref 11.5–15.5)
WBC: 8.4 10*3/uL (ref 4.0–10.5)
nRBC: 0 % (ref 0.0–0.2)

## 2022-01-24 LAB — URINALYSIS, ROUTINE W REFLEX MICROSCOPIC
Bilirubin Urine: NEGATIVE
Glucose, UA: NEGATIVE mg/dL
Hgb urine dipstick: NEGATIVE
Ketones, ur: NEGATIVE mg/dL
Leukocytes,Ua: NEGATIVE
Nitrite: NEGATIVE
Protein, ur: NEGATIVE mg/dL
Specific Gravity, Urine: 1.015 (ref 1.005–1.030)
pH: 7.5 (ref 5.0–8.0)

## 2022-01-24 LAB — COMPREHENSIVE METABOLIC PANEL
ALT: 14 U/L (ref 0–44)
AST: 23 U/L (ref 15–41)
Albumin: 4.2 g/dL (ref 3.5–5.0)
Alkaline Phosphatase: 55 U/L (ref 38–126)
Anion gap: 6 (ref 5–15)
BUN: 18 mg/dL (ref 6–20)
CO2: 23 mmol/L (ref 22–32)
Calcium: 8.5 mg/dL — ABNORMAL LOW (ref 8.9–10.3)
Chloride: 108 mmol/L (ref 98–111)
Creatinine, Ser: 0.81 mg/dL (ref 0.44–1.00)
GFR, Estimated: >60 mL/min (ref >60)
Glucose, Bld: 103 mg/dL — ABNORMAL HIGH (ref 70–99)
Potassium: 4.3 mmol/L (ref 3.5–5.1)
Sodium: 137 mmol/L (ref 135–145)
Total Bilirubin: 1.6 mg/dL — ABNORMAL HIGH (ref 0.3–1.2)
Total Protein: 7.3 g/dL (ref 6.5–8.1)

## 2022-01-24 LAB — PREGNANCY, URINE: Preg Test, Ur: NEGATIVE

## 2022-01-24 LAB — LIPASE, BLOOD: Lipase: 36 U/L (ref 11–51)

## 2022-01-24 MED ORDER — LACTATED RINGERS IV BOLUS
1000.0000 mL | Freq: Once | INTRAVENOUS | Status: AC
  Administered 2022-01-24: 1000 mL via INTRAVENOUS

## 2022-01-24 MED ORDER — PANTOPRAZOLE SODIUM 20 MG PO TBEC: 40.0000 mg | DELAYED_RELEASE_TABLET | Freq: Two times a day (BID) | ORAL | 0 refills | Status: AC

## 2022-01-24 MED ORDER — ONDANSETRON 4 MG PO TBDP: 4.0000 mg | ORAL_TABLET | Freq: Three times a day (TID) | ORAL | 0 refills | Status: AC | PRN

## 2022-01-24 MED ORDER — IOHEXOL 300 MG/ML  SOLN
75.0000 mL | Freq: Once | INTRAMUSCULAR | Status: AC | PRN
  Administered 2022-01-24: 75 mL via INTRAVENOUS

## 2022-01-24 NOTE — ED Notes (Signed)
ULTRASOUND IN PROCESS

## 2022-01-24 NOTE — ED Notes (Signed)
Pt has returned to their room 

## 2022-01-24 NOTE — ED Triage Notes (Signed)
Patient c/o abdominal pain under ribs ongoing "for awhile." Patient went out of town recently, pain has increased. Patient has not been eat or drink today and started having diarrhea since 3 am.

## 2022-01-24 NOTE — ED Notes (Signed)
Urine specimen left in lab

## 2022-01-24 NOTE — ED Provider Notes (Signed)
[facility] EMERGENCY DEPARTMENT Provider Note   CSN: [id number] Arrival date & time: 01/24/22  3299     History  Chief Complaint  Patient presents with   Abdominal Pain    Erin Thompson is a 46 y.o. female.  HPI      46yo female with history of polyps presents with concern for abdominal pain.   Tried tums, gas-ex for the pain. Worsening over night.  1 week of RUQ abdominal pain, worse with eating. A few months ago had it after eating then improved.  This has been constant now since Wedneday. Now not eating because of pain.   No vomiting. Mild nausea Diarrhea started last night No fever or chills. No urinary symptoms, vaginal bleeding or discharge. No black or bloody stools. Sharp pain.  Had been constant but not feeling it at this moment.  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  Past Medical History:  Diagnosis Date   Fracture of right wrist    Hemorrhoids    History of chlamydia    Lipoma    left hip   Tubular adenoma of colon     Home Medications Prior to Admission medications   Medication Sig Start Date End Date Taking? Authorizing Provider  ondansetron (ZOFRAN-ODT) 4 MG disintegrating tablet Take 1 tablet (4 mg total) by mouth every 8 (eight) hours as needed for nausea or vomiting. 01/24/22  Yes Gareth Morgan, MD  pantoprazole (PROTONIX) 20 MG tablet Take 2 tablets (40 mg total) by mouth 2 (two) times daily for 14 days. 01/24/22 02/07/22 Yes Gareth Morgan, MD  ELDERBERRY PO Take by mouth.    [provider]  Multiple Vitamins-Minerals (MULTIVITAMIN WITH MINERALS) tablet Take 1 tablet by mouth daily.    [provider]      Allergies    Patient has no known allergies.    Review of Systems   Review of Systems  Physical Exam Updated Vital Signs BP 121/69 (BP Location: Right Arm)   Pulse 78   Temp 98.7 F (37.1 C) (Oral)   Resp 16   Ht '5\' 9"'$  (1.753 m)   Wt 67.1 kg   LMP 12/18/2021 (Approximate)   SpO2 100%    Breastfeeding No   BMI 21.86 kg/m  Physical Exam Vitals and nursing note reviewed.  Constitutional:      General: She is not in acute distress.    Appearance: Normal appearance. She is not ill-appearing, toxic-appearing or diaphoretic.  HENT:     Head: Normocephalic.  Eyes:     Conjunctiva/sclera: Conjunctivae normal.  Cardiovascular:     Rate and Rhythm: Normal rate and regular rhythm.     Pulses: Normal pulses.  Pulmonary:     Effort: Pulmonary effort is normal. No respiratory distress.  Abdominal:     General: Abdomen is flat. There is no distension.     Palpations: Abdomen is soft.     Tenderness: There is no abdominal tenderness (denies tenderness at this time but notes severe pain intermittently). Negative signs include Murphy's sign and McBurney's sign.  Musculoskeletal:        General: No deformity or signs of injury.     Cervical back: No rigidity.  Skin:    General: Skin is warm and dry.     Coloration: Skin is not jaundiced or pale.  Neurological:     General: No focal deficit present.     Mental Status: She is alert and oriented to person, place, and time.  ED Results / Procedures / Treatments   Labs (all labs ordered are listed, but only abnormal results are displayed) Labs Reviewed  CBC WITH DIFFERENTIAL/PLATELET - Abnormal; Notable for the following components:      Result Value   Lymphs Abs 0.5 (*)    All other components within normal limits  COMPREHENSIVE METABOLIC PANEL - Abnormal; Notable for the following components:   Glucose, Bld 103 (*)    Calcium 8.5 (*)    Total Bilirubin 1.6 (*)    All other components within normal limits  PREGNANCY, URINE  URINALYSIS, ROUTINE W REFLEX MICROSCOPIC  LIPASE, BLOOD    EKG EKG Interpretation  Date/Time:  Saturday January 24 2022 09:48:07 EST Ventricular Rate:  79 PR Interval:  127 QRS Duration: 90 QT Interval:  377 QTC Calculation: 433 R Axis:   99 Text Interpretation: Sinus rhythm Borderline  right axis deviation No previous ECGs available Confirmed by Gareth Morgan [phone]) on 01/24/2022 11:27:40 AM  Radiology CT ABDOMEN PELVIS W CONTRAST  Result Date: 01/24/2022 CLINICAL DATA:  Right lower quadrant pain for a while.  Diarrhea. EXAM: CT ABDOMEN AND PELVIS WITH CONTRAST TECHNIQUE: Multidetector CT imaging of the abdomen and pelvis was performed using the standard protocol following bolus administration of intravenous contrast. RADIATION DOSE REDUCTION: This exam was performed according to the departmental dose-optimization program which includes automated exposure control, adjustment of the mA and/or kV according to patient size and/or use of iterative reconstruction technique. CONTRAST:  61m OMNIPAQUE IOHEXOL 300 MG/ML  SOLN COMPARISON:  October 01, 2011 CT of the abdomen and pelvis FINDINGS: Lower chest: No acute abnormality. Hepatobiliary: Hepatic steatosis. A few tiny hypoechoic lesions are consistent with small cysts. No suspicious masses are identified. Portal vein is patent. The gallbladder is normal. Pancreas: Unremarkable. No pancreatic ductal dilatation or surrounding inflammatory changes. Spleen: Normal in size without focal abnormality. Adrenals/Urinary Tract: Adrenal glands are normal. 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 No suspicious mass, hydronephrosis, or perinephric stranding. No urate renal stones or obstruction. The bladder is normal. The left kidney is stable. No hydronephrosis. The left ureter is unremarkable. Stomach/Bowel: Evaluation of the bowel is limited due to lack of oral contrast. The stomach and small bowel are normal. The colon is nondistended with no wall thickening. Fluid in the colon is consistent with history of diarrhea. No evidence of obstruction. The appendix is somewhat difficult to visualize but is best seen on coronal imaging. No evidence of appendicitis. Vascular/Lymphatic: No significant  vascular findings are present. No enlarged abdominal or pelvic lymph nodes. Reproductive: The uterus and ovaries are unremarkable. Other: A tiny amount of fluid in the pelvis is likely physiologic. No free air noted. Musculoskeletal: No acute or significant osseous findings. IMPRESSION: 1. No cause for the patient's symptoms identified. 2. Hepatic steatosis. 3. 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 4. Fluid in the colon is consistent with history of diarrhea. No colonic wall thickening. 5. Tiny amount of fluid in the pelvis is likely physiologic. 6. No evidence of appendicitis. Electronically Signed   By: DDorise BullionIII M.D.   On: 01/24/2022 11:47   [place]Abdomen Limited RUQ (LIVER/GB)  Result Date: 01/24/2022 CLINICAL DATA:  Right upper quadrant pain EXAM: ULTRASOUND ABDOMEN LIMITED RIGHT UPPER QUADRANT COMPARISON:  None Available. FINDINGS: Gallbladder: No gallstones or wall thickening visualized. No sonographic Murphy sign noted by sonographer. Common bile duct:  Diameter: 3.5 mm Liver: No focal lesion identified. Within normal limits in parenchymal echogenicity. Portal vein is patent on color Doppler imaging with normal direction of blood flow towards the liver. Other: None. IMPRESSION: Normal study. No cause for pain identified. Electronically Signed   By: Dorise Bullion III M.D.   On: 01/24/2022 10:34    Procedures Procedures    Medications Ordered in ED Medications  lactated ringers bolus 1,000 mL ( Intravenous Stopped 01/24/22 1110)  iohexol (OMNIPAQUE) 300 MG/ML solution 75 mL (75 mLs Intravenous Contrast Given 01/24/22 1130)    ED Course/ Medical Decision Making/ A&P                            46yo female with history of polyps presents with concern for abdominal pain.   DDx includes appendicitis, pancreatitis, cholecystitis, pyelonephritis, nephrolithiasis, diverticulitis, PID, ovarian torsion, ectopic pregnancy, and  tuboovarian abscess.   Labs by me showed no leukocytosis, no anemia, no transaminitis, no sign of pancreatitis.  Pregnancy test is negative and urinalysis shows no signs of infection.  RUQ [place] ordered given RUQ pain with eating to evaluate for cholelithiasis/cholecystitis shows no abnormalities.  Given sharp episodes of pain, will evaluate with CT abdomen pelvis to look for bowel obstruction, hernial, nephrolithiasis.  CT completed showing no acute findings, does show findings consistent with diarrhea, prior noted malrotation of kidney with stable prominence of right lower pole calyx since 2013.  No ovarian abnormalities and doubt pelvic etiology of pain given location.  Low suspicion for PE given no dyspnea, no pleuritic pain, no asymmetric leg swelling, no tachycardia nor hypoxia and pain worse with eating.   Suspect likely gastritis/PUD as etiology of pain.  Given rx for protonix, zofran.  Recommend PCP/GI follow up.  Patient discharged in stable condition with understanding of reasons to return.             Final Clinical Impression(s) / ED Diagnoses Final diagnoses:  Epigastric pain  Diarrhea of presumed infectious origin    Rx / DC Orders ED Discharge Orders          Ordered    ondansetron (ZOFRAN-ODT) 4 MG disintegrating tablet  Every 8 hours PRN        01/24/22 1202    pantoprazole (PROTONIX) 20 MG tablet  2 times daily        01/24/22 1202              Gareth Morgan, MD 01/24/22 2220

## 2022-01-27 ENCOUNTER — Telehealth: Payer: Self-pay

## 2022-01-27 NOTE — Telephone Encounter (Signed)
Transition Care Management Unsuccessful Follow-up Telephone Call  Date of discharge and from where:  01/24/2022  Attempts:  1st Attempt  Reason for unsuccessful TCM follow-up call:  Left voice message

## 2022-01-28 ENCOUNTER — Telehealth: Payer: Self-pay

## 2022-01-28 NOTE — Telephone Encounter (Signed)
Pyrtle, Lajuan Lines, MD  Algernon Huxley, RN Pt in ER Pls contact and see if she wants appt with app Thanks JMP  Called pt and left a message for her to call the office if she wanted to schedule a follow-up appt for epigastric pain.

## 2022-03-13 IMAGING — US US ABDOMEN COMPLETE
2 series · 14 of 25 positions shown · non-contrast
Comparison: 08/28/2011 CT

CLINICAL DATA: Epigastric abdominal pain

EXAM:
ABDOMEN ULTRASOUND COMPLETE

[Series 1: us abdomen complete · 13 of 71 slices shown (1 of 2)]
[im 1/71]
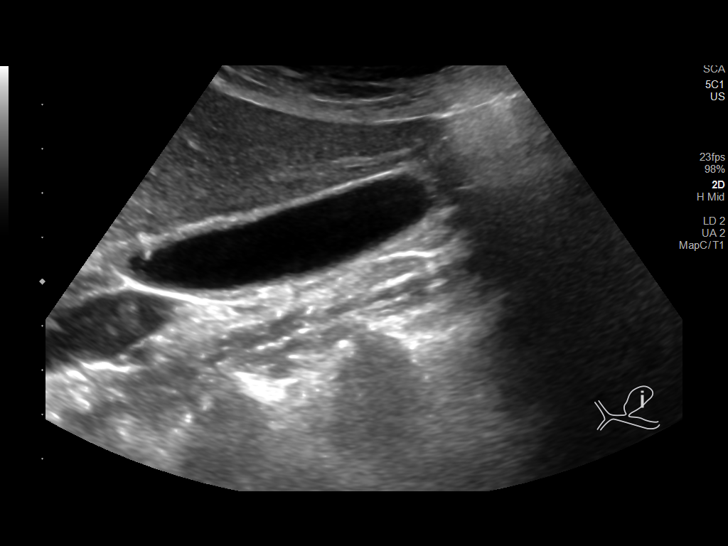
[im 7/71]
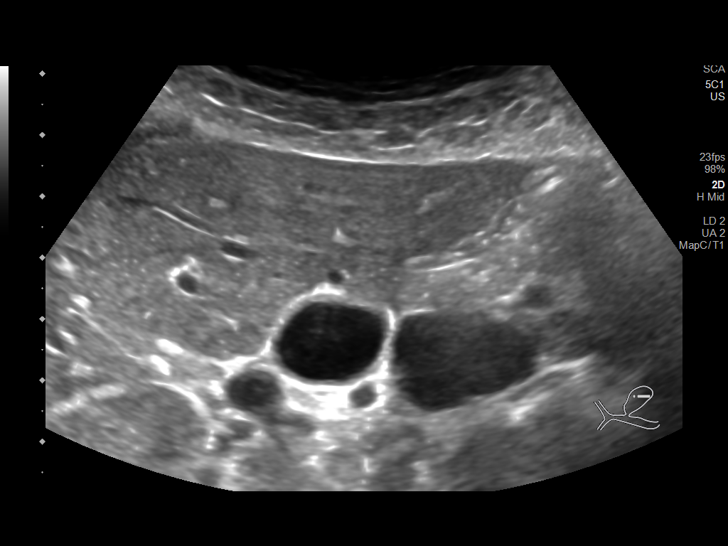
[im 13/71]
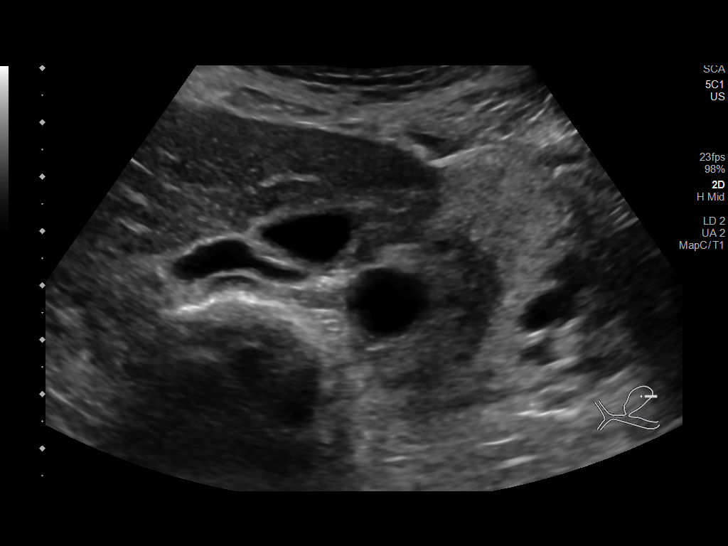
[im 19/71]
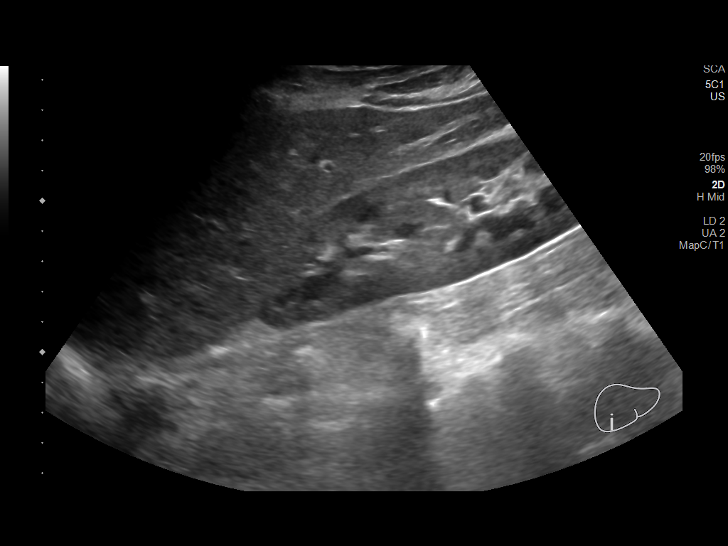
[im 25/71]
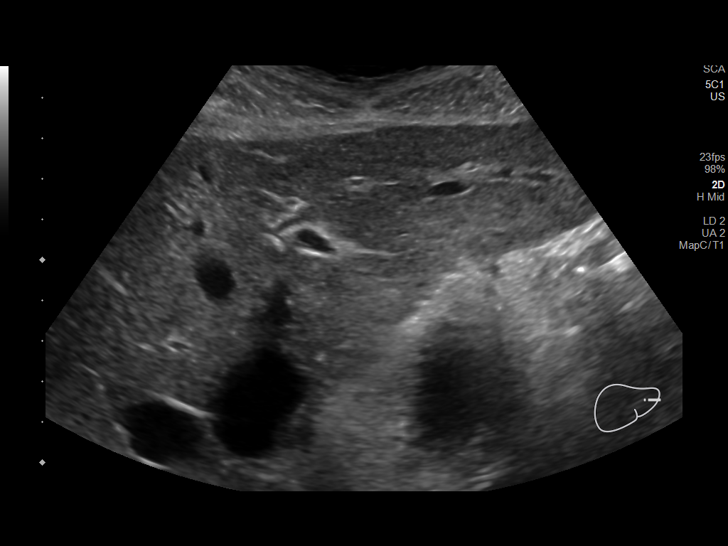
[im 28/71]
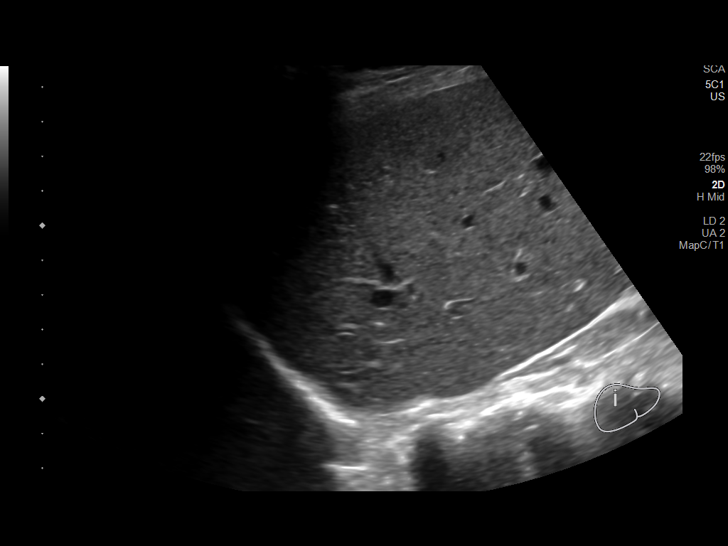
[im 34/71]
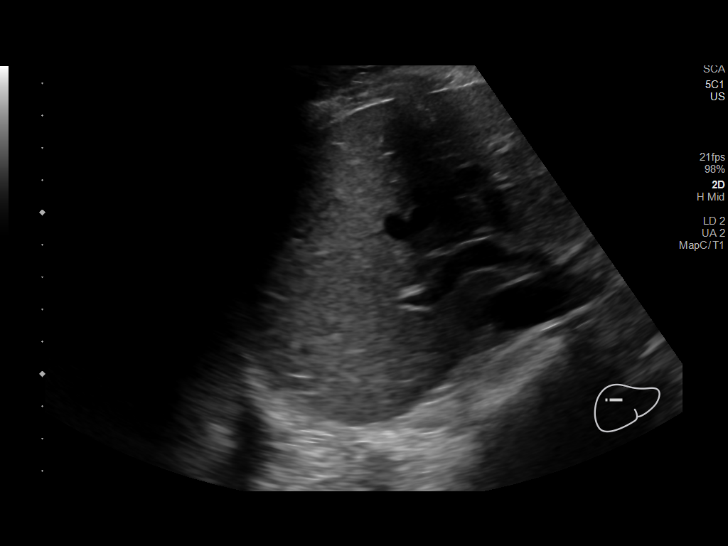
[im 40/71]
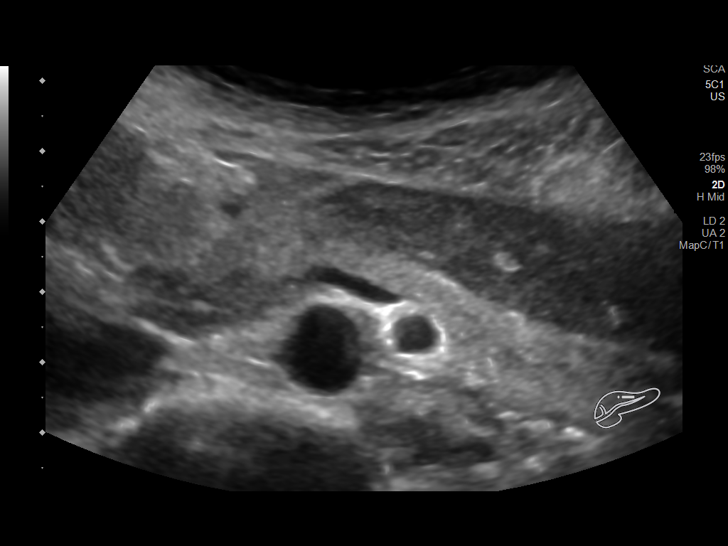
[im 46/71]
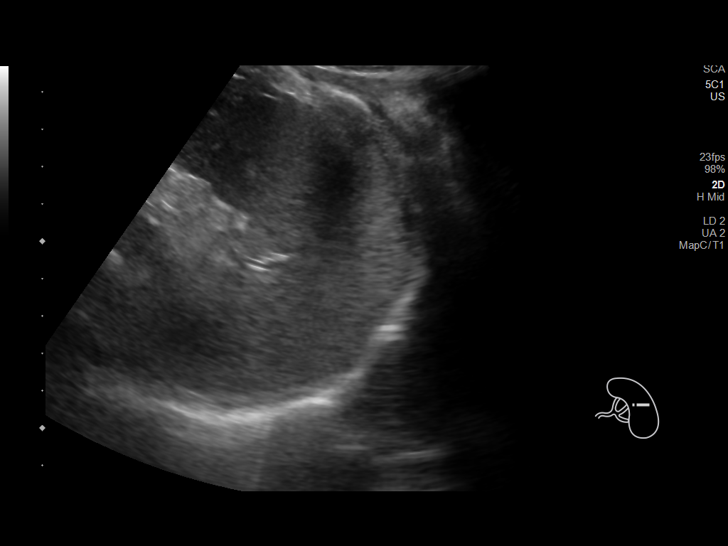
[im 49/71]
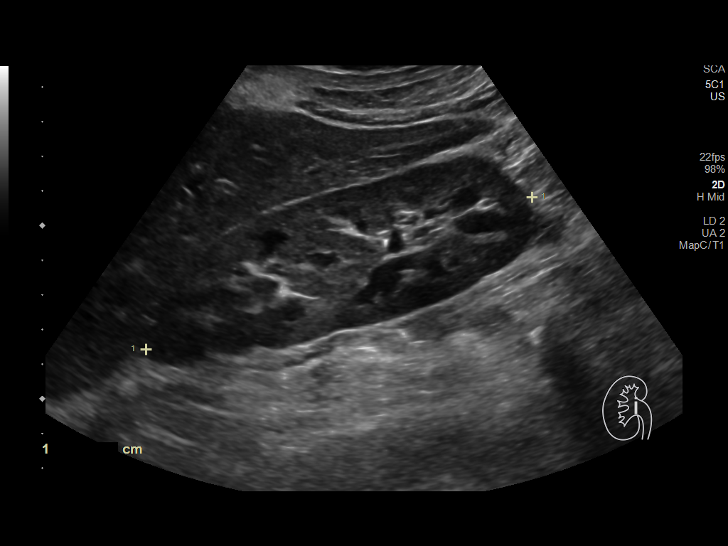
[im 55/71]
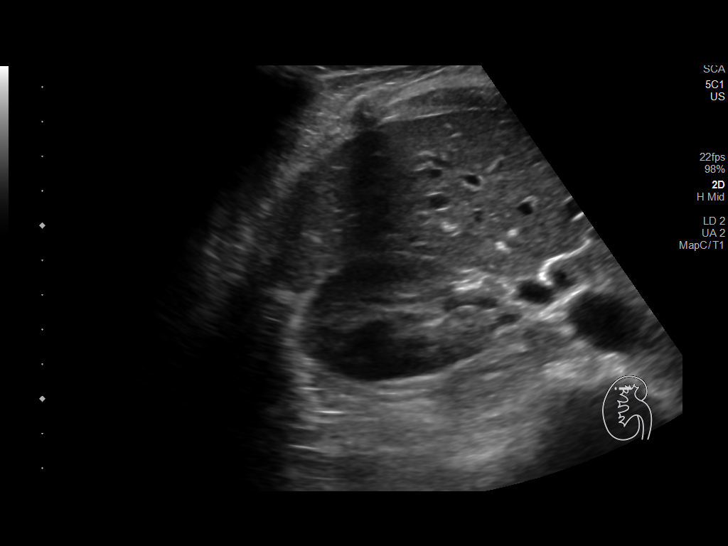
[im 61/71]
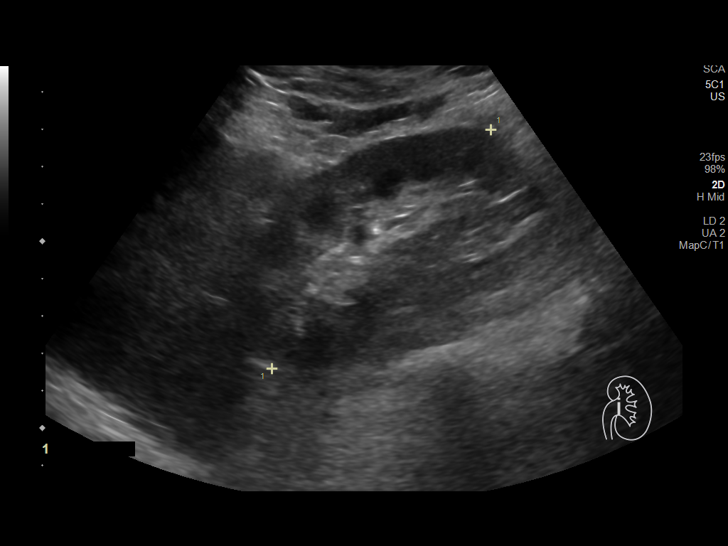
[im 67/71]
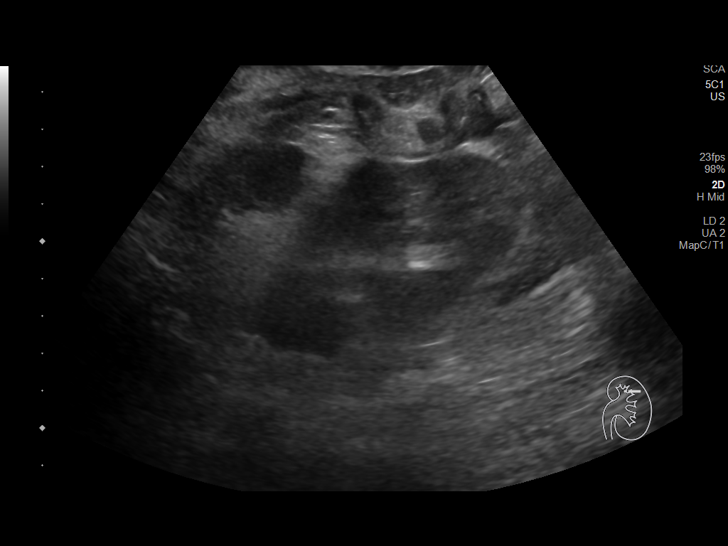

[Series 2: us abdomen complete · 1 of 1 slices shown (2 of 2)]
[im 1/1]
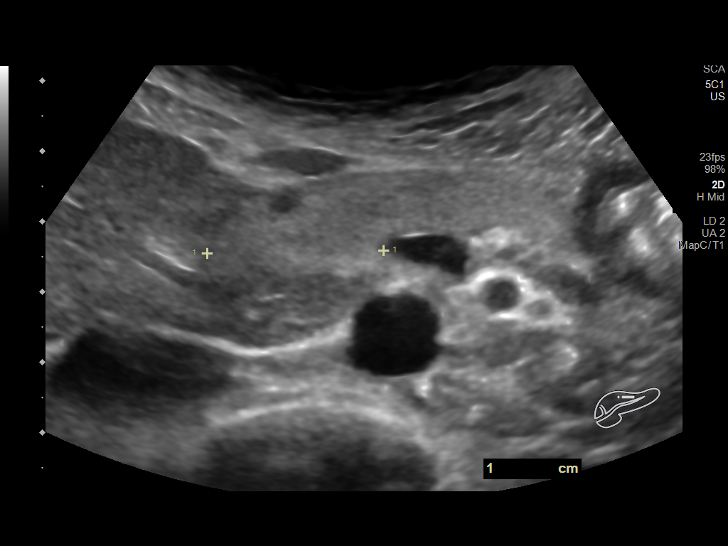

[14 of 25 positions shown; findings below may reference images not displayed]

FINDINGS: Gallbladder: No gallstones or wall thickening visualized. No
sonographic Murphy sign noted by sonographer.

Common bile duct: Diameter: Normal, 2 mm.

Liver: No focal lesion identified. Within normal limits in
parenchymal echogenicity. Portal vein is patent on color Doppler
imaging with normal direction of blood flow towards the liver.

IVC: No abnormality visualized.

Pancreas: Visualized portion unremarkable.

Spleen: Size and appearance within normal limits.

Right Kidney: Length: 12.0 cm. Echogenicity within normal limits. No
mass or hydronephrosis visualized.

Left Kidney: Length: 9.2 cm. Normal echogenicity. No
hydronephrosis. Mild renal cortical thinning and scarring, as on

Abdominal aorta: No aneurysm visualized.

Other findings: None.
IMPRESSION: 1.  No acute process or explanation for abdominal pain.
2. Mild left renal atrophy.

## 2022-05-04 ENCOUNTER — Encounter: Payer: Self-pay | Admitting: Internal Medicine

## 2022-05-04 NOTE — Progress Notes (Unsigned)
    Subjective:    Patient ID: Erin Thompson, female    DOB: May 26, 1975, 47 y.o.   MRN: [id number]      HPI Erin Thompson is here for No chief complaint on file.    Pain in back   Congestion -     Medications and allergies reviewed with patient and updated if appropriate.  Current Outpatient Medications on File Prior to Visit  Medication Sig Dispense Refill   ELDERBERRY PO Take by mouth.     Multiple Vitamins-Minerals (MULTIVITAMIN WITH MINERALS) tablet Take 1 tablet by mouth daily.     ondansetron (ZOFRAN-ODT) 4 MG disintegrating tablet Take 1 tablet (4 mg total) by mouth every 8 (eight) hours as needed for nausea or vomiting. 20 tablet 0   pantoprazole (PROTONIX) 20 MG tablet Take 2 tablets (40 mg total) by mouth 2 (two) times daily for 14 days. 56 tablet 0   No current facility-administered medications on file prior to visit.    Review of Systems     Objective:  There were no vitals filed for this visit. BP Readings from Last 3 Encounters:  01/24/22 121/69  01/02/22 (!) 102/53  03/27/21 110/72   Wt Readings from Last 3 Encounters:  01/24/22 148 lb (67.1 kg)  01/02/22 148 lb (67.1 kg)  12/12/21 148 lb (67.1 kg)   There is no height or weight on file to calculate BMI.    Physical Exam         Assessment & Plan:    See Problem List for Assessment and Plan of chronic medical problems.

## 2022-05-05 ENCOUNTER — Other Ambulatory Visit (INDEPENDENT_AMBULATORY_CARE_PROVIDER_SITE_OTHER): Payer: BC Managed Care – PPO

## 2022-05-05 ENCOUNTER — Other Ambulatory Visit: Payer: Self-pay

## 2022-05-05 ENCOUNTER — Ambulatory Visit (INDEPENDENT_AMBULATORY_CARE_PROVIDER_SITE_OTHER): Payer: BC Managed Care – PPO | Admitting: Internal Medicine

## 2022-05-05 VITALS — BP 114/80 | HR 78 | Temp 98.6°F | Ht 69.0 in | Wt 145.0 lb

## 2022-05-05 DIAGNOSIS — R350 Frequency of micturition: Secondary | ICD-10-CM | POA: Diagnosis not present

## 2022-05-05 DIAGNOSIS — J069 Acute upper respiratory infection, unspecified: Secondary | ICD-10-CM

## 2022-05-05 DIAGNOSIS — J029 Acute pharyngitis, unspecified: Secondary | ICD-10-CM

## 2022-05-05 DIAGNOSIS — M545 Low back pain, unspecified: Secondary | ICD-10-CM

## 2022-05-05 DIAGNOSIS — M549 Dorsalgia, unspecified: Secondary | ICD-10-CM | POA: Diagnosis not present

## 2022-05-05 LAB — CBC WITH DIFFERENTIAL/PLATELET
Basophils Absolute: 0.1 10*3/uL (ref 0.0–0.1)
Basophils Relative: 0.9 % (ref 0.0–3.0)
Eosinophils Absolute: 0.1 10*3/uL (ref 0.0–0.7)
Eosinophils Relative: 1.2 % (ref 0.0–5.0)
HCT: 42.8 % (ref 36.0–46.0)
Hemoglobin: 14.3 g/dL (ref 12.0–15.0)
Lymphocytes Relative: 28.3 % (ref 12.0–46.0)
Lymphs Abs: 1.7 10*3/uL (ref 0.7–4.0)
MCHC: 33.4 g/dL (ref 30.0–36.0)
MCV: 90.9 fl (ref 78.0–100.0)
Monocytes Absolute: 0.7 10*3/uL (ref 0.1–1.0)
Monocytes Relative: 11 % (ref 3.0–12.0)
Neutro Abs: 3.5 10*3/uL (ref 1.4–7.7)
Neutrophils Relative %: 58.6 % (ref 43.0–77.0)
Platelets: 210 10*3/uL (ref 150.0–400.0)
RBC: 4.71 Mil/uL (ref 3.87–5.11)
RDW: 14.1 % (ref 11.5–15.5)
WBC: 6 10*3/uL (ref 4.0–10.5)

## 2022-05-05 LAB — POC URINALSYSI DIPSTICK (AUTOMATED)
Bilirubin, UA: NEGATIVE
Blood, UA: NEGATIVE
Glucose, UA: NEGATIVE
Ketones, UA: NEGATIVE
Leukocytes, UA: NEGATIVE
Nitrite, UA: NEGATIVE
Protein, UA: NEGATIVE
Spec Grav, UA: 1.02 (ref 1.010–1.025)
Urobilinogen, UA: 0.2 E.U./dL
pH, UA: 6 (ref 5.0–8.0)

## 2022-05-05 LAB — COMPREHENSIVE METABOLIC PANEL
ALT: 11 U/L (ref 0–35)
AST: 16 U/L (ref 0–37)
Albumin: 4.2 g/dL (ref 3.5–5.2)
Alkaline Phosphatase: 63 U/L (ref 39–117)
BUN: 17 mg/dL (ref 6–23)
CO2: 30 mEq/L (ref 19–32)
Calcium: 9.7 mg/dL (ref 8.4–10.5)
Chloride: 105 mEq/L (ref 96–112)
Creatinine, Ser: 0.91 mg/dL (ref 0.40–1.20)
GFR: 76.83 mL/min (ref >60.00)
Glucose, Bld: 136 mg/dL — ABNORMAL HIGH (ref 70–99)
Potassium: 4.1 mEq/L (ref 3.5–5.1)
Sodium: 141 mEq/L (ref 135–145)
Total Bilirubin: 0.5 mg/dL (ref 0.2–1.2)
Total Protein: 7.2 g/dL (ref 6.0–8.3)

## 2022-05-05 NOTE — Assessment & Plan Note (Signed)
Acute Symptoms started one week ago Likely viral in nature - coxsackie, herpetic, vs other Symptoms slightly better - likely will continue to improve Husband had mono 2 weeks ago - will check that, cbc, cmp Symptomatic treatment Call if symptoms are not improving

## 2022-05-05 NOTE — Assessment & Plan Note (Signed)
Acute Blister left posterior throat with URI symptoms Likely viral in nature Kids tested neg for covid, unlikely rsv or flu ? Mono - will test for it, cbc, cmp Unlikely strep

## 2022-05-05 NOTE — Assessment & Plan Note (Signed)
Acute Some frequency - no other symptoms Urine dip neg  - will send for dx

## 2022-05-05 NOTE — Assessment & Plan Note (Signed)
Acute Started one week ago Across mid back - L> R Not worse with movement Slight urine frequency - urine dip neg - will send for cx - no abx unless positive ? Msk  -  - improves with advil - continue advil as needed

## 2022-05-05 NOTE — Patient Instructions (Addendum)
      Blood work was ordered.      Medications changes include :   none     Return if symptoms worsen or fail to improve.

## 2022-05-06 LAB — MONONUCLEOSIS SCREEN: Mono Screen: NEGATIVE

## 2022-05-07 ENCOUNTER — Encounter: Payer: Self-pay | Admitting: Internal Medicine

## 2022-05-07 LAB — CULTURE, URINE COMPREHENSIVE
MICRO NUMBER:: 14620447
SPECIMEN QUALITY:: ADEQUATE

## 2022-05-12 MED ORDER — AMOXICILLIN-POT CLAVULANATE 875-125 MG PO TABS: 1.0000 | ORAL_TABLET | Freq: Two times a day (BID) | ORAL | 0 refills | Status: AC

## 2022-05-12 NOTE — Addendum Note (Signed)
Addended by: Binnie Rail on: 05/12/2022 12:07 PM   Modules accepted: Orders

## 2022-10-19 DIAGNOSIS — Z1231 Encounter for screening mammogram for malignant neoplasm of breast: Secondary | ICD-10-CM | POA: Diagnosis not present

## 2022-12-11 DIAGNOSIS — Z808 Family history of malignant neoplasm of other organs or systems: Secondary | ICD-10-CM | POA: Diagnosis not present

## 2022-12-11 DIAGNOSIS — Z01419 Encounter for gynecological examination (general) (routine) without abnormal findings: Secondary | ICD-10-CM | POA: Diagnosis not present

## 2022-12-11 DIAGNOSIS — Z801 Family history of malignant neoplasm of trachea, bronchus and lung: Secondary | ICD-10-CM | POA: Diagnosis not present

## 2022-12-11 DIAGNOSIS — Z800 Family history of malignant neoplasm of digestive organs: Secondary | ICD-10-CM | POA: Diagnosis not present

## 2022-12-11 DIAGNOSIS — Z124 Encounter for screening for malignant neoplasm of cervix: Secondary | ICD-10-CM | POA: Diagnosis not present

## 2022-12-11 DIAGNOSIS — Z803 Family history of malignant neoplasm of breast: Secondary | ICD-10-CM | POA: Diagnosis not present

## 2022-12-30 DIAGNOSIS — L820 Inflamed seborrheic keratosis: Secondary | ICD-10-CM | POA: Diagnosis not present

## 2022-12-30 DIAGNOSIS — L814 Other melanin hyperpigmentation: Secondary | ICD-10-CM | POA: Diagnosis not present

## 2022-12-30 DIAGNOSIS — D2262 Melanocytic nevi of left upper limb, including shoulder: Secondary | ICD-10-CM | POA: Diagnosis not present

## 2022-12-30 DIAGNOSIS — D2261 Melanocytic nevi of right upper limb, including shoulder: Secondary | ICD-10-CM | POA: Diagnosis not present

## 2022-12-30 DIAGNOSIS — D225 Melanocytic nevi of trunk: Secondary | ICD-10-CM | POA: Diagnosis not present

## 2023-02-09 DIAGNOSIS — Z7183 Encounter for nonprocreative genetic counseling: Secondary | ICD-10-CM | POA: Diagnosis not present

## 2023-02-09 DIAGNOSIS — L729 Follicular cyst of the skin and subcutaneous tissue, unspecified: Secondary | ICD-10-CM | POA: Diagnosis not present

## 2023-02-09 DIAGNOSIS — D485 Neoplasm of uncertain behavior of skin: Secondary | ICD-10-CM | POA: Diagnosis not present

## 2023-03-24 ENCOUNTER — Other Ambulatory Visit: Payer: Self-pay | Admitting: Obstetrics and Gynecology

## 2023-03-24 DIAGNOSIS — Z803 Family history of malignant neoplasm of breast: Secondary | ICD-10-CM

## 2023-04-16 ENCOUNTER — Ambulatory Visit
Admission: RE | Admit: 2023-04-16 | Discharge: 2023-04-16 | Disposition: A | Discharge status: Home / Self Care | Payer: BC Managed Care – PPO | Source: Ambulatory Visit | Attending: Obstetrics and Gynecology | Admitting: Obstetrics and Gynecology

## 2023-04-16 DIAGNOSIS — Z1239 Encounter for other screening for malignant neoplasm of breast: Secondary | ICD-10-CM | POA: Diagnosis not present

## 2023-04-16 DIAGNOSIS — Z803 Family history of malignant neoplasm of breast: Secondary | ICD-10-CM

## 2023-04-16 MED ORDER — GADOPICLENOL 0.5 MMOL/ML IV SOLN
6.0000 mL | Freq: Once | INTRAVENOUS | Status: AC | PRN
  Administered 2023-04-16: 6 mL via INTRAVENOUS

## 2023-07-29 DIAGNOSIS — E559 Vitamin D deficiency, unspecified: Secondary | ICD-10-CM | POA: Diagnosis not present

## 2023-07-29 DIAGNOSIS — E039 Hypothyroidism, unspecified: Secondary | ICD-10-CM | POA: Diagnosis not present

## 2023-07-29 DIAGNOSIS — R5383 Other fatigue: Secondary | ICD-10-CM | POA: Diagnosis not present

## 2023-07-29 DIAGNOSIS — Z131 Encounter for screening for diabetes mellitus: Secondary | ICD-10-CM | POA: Diagnosis not present

## 2023-07-29 DIAGNOSIS — E611 Iron deficiency: Secondary | ICD-10-CM | POA: Diagnosis not present

## 2023-07-29 DIAGNOSIS — N959 Unspecified menopausal and perimenopausal disorder: Secondary | ICD-10-CM | POA: Diagnosis not present

## 2023-07-29 DIAGNOSIS — D519 Vitamin B12 deficiency anemia, unspecified: Secondary | ICD-10-CM | POA: Diagnosis not present

## 2023-08-16 DIAGNOSIS — E559 Vitamin D deficiency, unspecified: Secondary | ICD-10-CM | POA: Diagnosis not present

## 2023-08-16 DIAGNOSIS — N959 Unspecified menopausal and perimenopausal disorder: Secondary | ICD-10-CM | POA: Diagnosis not present

## 2023-08-16 DIAGNOSIS — E039 Hypothyroidism, unspecified: Secondary | ICD-10-CM | POA: Diagnosis not present

## 2023-08-16 DIAGNOSIS — Z131 Encounter for screening for diabetes mellitus: Secondary | ICD-10-CM | POA: Diagnosis not present

## 2023-08-16 DIAGNOSIS — D519 Vitamin B12 deficiency anemia, unspecified: Secondary | ICD-10-CM | POA: Diagnosis not present

## 2023-08-16 DIAGNOSIS — R5383 Other fatigue: Secondary | ICD-10-CM | POA: Diagnosis not present

## 2023-08-16 DIAGNOSIS — E538 Deficiency of other specified B group vitamins: Secondary | ICD-10-CM | POA: Diagnosis not present

## 2023-08-16 DIAGNOSIS — M2550 Pain in unspecified joint: Secondary | ICD-10-CM | POA: Diagnosis not present

## 2023-08-16 DIAGNOSIS — R6882 Decreased libido: Secondary | ICD-10-CM | POA: Diagnosis not present

## 2023-08-25 DIAGNOSIS — J00 Acute nasopharyngitis [common cold]: Secondary | ICD-10-CM | POA: Diagnosis not present

## 2023-08-25 DIAGNOSIS — Z03818 Encounter for observation for suspected exposure to other biological agents ruled out: Secondary | ICD-10-CM | POA: Diagnosis not present

## 2023-09-06 DIAGNOSIS — E039 Hypothyroidism, unspecified: Secondary | ICD-10-CM | POA: Diagnosis not present

## 2023-09-06 DIAGNOSIS — E611 Iron deficiency: Secondary | ICD-10-CM | POA: Diagnosis not present

## 2023-09-06 DIAGNOSIS — N959 Unspecified menopausal and perimenopausal disorder: Secondary | ICD-10-CM | POA: Diagnosis not present

## 2023-09-06 DIAGNOSIS — R5383 Other fatigue: Secondary | ICD-10-CM | POA: Diagnosis not present

## 2023-10-02 DIAGNOSIS — Z03818 Encounter for observation for suspected exposure to other biological agents ruled out: Secondary | ICD-10-CM | POA: Diagnosis not present

## 2023-10-02 DIAGNOSIS — J029 Acute pharyngitis, unspecified: Secondary | ICD-10-CM | POA: Diagnosis not present

## 2023-10-20 ENCOUNTER — Other Ambulatory Visit: Payer: Self-pay | Admitting: Chiropractic Medicine

## 2023-10-20 DIAGNOSIS — M545 Low back pain, unspecified: Secondary | ICD-10-CM

## 2023-10-23 ENCOUNTER — Ambulatory Visit
Admission: RE | Admit: 2023-10-23 | Discharge: 2023-10-23 | Disposition: A | Discharge status: Home / Self Care | Source: Ambulatory Visit | Attending: Chiropractic Medicine | Admitting: Chiropractic Medicine

## 2023-10-23 DIAGNOSIS — M5127 Other intervertebral disc displacement, lumbosacral region: Secondary | ICD-10-CM | POA: Diagnosis not present

## 2023-10-23 DIAGNOSIS — M545 Low back pain, unspecified: Secondary | ICD-10-CM

## 2023-10-25 DIAGNOSIS — Z1231 Encounter for screening mammogram for malignant neoplasm of breast: Secondary | ICD-10-CM | POA: Diagnosis not present

## 2023-10-26 DIAGNOSIS — D2261 Melanocytic nevi of right upper limb, including shoulder: Secondary | ICD-10-CM | POA: Diagnosis not present

## 2023-10-26 DIAGNOSIS — D485 Neoplasm of uncertain behavior of skin: Secondary | ICD-10-CM | POA: Diagnosis not present

## 2023-11-09 DIAGNOSIS — L578 Other skin changes due to chronic exposure to nonionizing radiation: Secondary | ICD-10-CM | POA: Diagnosis not present

## 2023-11-09 DIAGNOSIS — L814 Other melanin hyperpigmentation: Secondary | ICD-10-CM | POA: Diagnosis not present

## 2023-11-09 DIAGNOSIS — L905 Scar conditions and fibrosis of skin: Secondary | ICD-10-CM | POA: Diagnosis not present

## 2023-11-09 DIAGNOSIS — D229 Melanocytic nevi, unspecified: Secondary | ICD-10-CM | POA: Diagnosis not present

## 2023-11-29 DIAGNOSIS — E611 Iron deficiency: Secondary | ICD-10-CM | POA: Diagnosis not present

## 2023-11-29 DIAGNOSIS — E039 Hypothyroidism, unspecified: Secondary | ICD-10-CM | POA: Diagnosis not present

## 2023-11-29 DIAGNOSIS — R5383 Other fatigue: Secondary | ICD-10-CM | POA: Diagnosis not present

## 2023-11-29 DIAGNOSIS — D519 Vitamin B12 deficiency anemia, unspecified: Secondary | ICD-10-CM | POA: Diagnosis not present

## 2023-11-29 DIAGNOSIS — N959 Unspecified menopausal and perimenopausal disorder: Secondary | ICD-10-CM | POA: Diagnosis not present

## 2023-11-29 DIAGNOSIS — E559 Vitamin D deficiency, unspecified: Secondary | ICD-10-CM | POA: Diagnosis not present

## 2023-11-29 DIAGNOSIS — M2550 Pain in unspecified joint: Secondary | ICD-10-CM | POA: Diagnosis not present

## 2023-12-13 DIAGNOSIS — E039 Hypothyroidism, unspecified: Secondary | ICD-10-CM | POA: Diagnosis not present

## 2023-12-13 DIAGNOSIS — R5383 Other fatigue: Secondary | ICD-10-CM | POA: Diagnosis not present

## 2023-12-13 DIAGNOSIS — N959 Unspecified menopausal and perimenopausal disorder: Secondary | ICD-10-CM | POA: Diagnosis not present

## 2023-12-13 DIAGNOSIS — E611 Iron deficiency: Secondary | ICD-10-CM | POA: Diagnosis not present

## 2023-12-14 DIAGNOSIS — L820 Inflamed seborrheic keratosis: Secondary | ICD-10-CM | POA: Diagnosis not present

## 2023-12-14 DIAGNOSIS — L821 Other seborrheic keratosis: Secondary | ICD-10-CM | POA: Diagnosis not present

## 2023-12-14 DIAGNOSIS — Z01419 Encounter for gynecological examination (general) (routine) without abnormal findings: Secondary | ICD-10-CM | POA: Diagnosis not present

## 2023-12-14 DIAGNOSIS — L579 Skin changes due to chronic exposure to nonionizing radiation, unspecified: Secondary | ICD-10-CM | POA: Diagnosis not present

## 2024-02-25 DIAGNOSIS — E611 Iron deficiency: Secondary | ICD-10-CM | POA: Diagnosis not present

## 2024-02-25 DIAGNOSIS — E039 Hypothyroidism, unspecified: Secondary | ICD-10-CM | POA: Diagnosis not present

## 2024-02-25 DIAGNOSIS — R5383 Other fatigue: Secondary | ICD-10-CM | POA: Diagnosis not present

## 2024-02-25 DIAGNOSIS — N959 Unspecified menopausal and perimenopausal disorder: Secondary | ICD-10-CM | POA: Diagnosis not present
